# Patient Record
Sex: Female | Born: 1935 | Race: White | Hispanic: No | Marital: Married
Health system: Southern US, Community
[De-identification: ages and names within clinical notes are randomized; demographics above are authoritative.]

---

## 2005-01-30 ENCOUNTER — Encounter: Admission: RE | Admit: 2005-01-30 | Discharge: 2005-01-30 | Payer: Self-pay | Admitting: Neurosurgery

## 2005-02-26 ENCOUNTER — Encounter: Admission: RE | Admit: 2005-02-26 | Discharge: 2005-02-26 | Payer: Self-pay | Admitting: Neurosurgery

## 2005-03-21 ENCOUNTER — Encounter: Admission: RE | Admit: 2005-03-21 | Discharge: 2005-03-21 | Payer: Self-pay | Admitting: Neurosurgery

## 2005-04-03 ENCOUNTER — Encounter: Admission: RE | Admit: 2005-04-03 | Discharge: 2005-04-03 | Payer: Self-pay | Admitting: Neurosurgery

## 2008-07-13 ENCOUNTER — Encounter: Admission: RE | Admit: 2008-07-13 | Discharge: 2008-07-13 | Payer: Self-pay | Admitting: Cardiovascular Disease

## 2008-07-15 ENCOUNTER — Inpatient Hospital Stay (HOSPITAL_COMMUNITY): Admission: RE | Admit: 2008-07-15 | Discharge: 2008-07-16 | Payer: Self-pay | Admitting: Cardiovascular Disease

## 2009-06-14 ENCOUNTER — Inpatient Hospital Stay (HOSPITAL_COMMUNITY): Admission: RE | Admit: 2009-06-14 | Discharge: 2009-06-20 | Payer: Self-pay | Admitting: Orthopedic Surgery

## 2009-11-08 ENCOUNTER — Inpatient Hospital Stay (HOSPITAL_COMMUNITY): Admission: RE | Admit: 2009-11-08 | Discharge: 2009-11-12 | Payer: Self-pay | Admitting: Orthopedic Surgery

## 2010-06-09 LAB — PREPARE RBC (CROSSMATCH)

## 2010-06-09 LAB — URINALYSIS, ROUTINE W REFLEX MICROSCOPIC
Bilirubin Urine: NEGATIVE
Ketones, ur: NEGATIVE mg/dL
Nitrite: POSITIVE — AB
Protein, ur: NEGATIVE mg/dL
Specific Gravity, Urine: 1.022 (ref 1.005–1.030)
Urobilinogen, UA: 1 mg/dL (ref 0.0–1.0)

## 2010-06-09 LAB — TYPE AND SCREEN
ABO/RH(D): O POS
Antibody Screen: POSITIVE
DAT, IgG: POSITIVE
Donor AG Type: NEGATIVE
Donor AG Type: NEGATIVE
Donor AG Type: NEGATIVE
Donor AG Type: NEGATIVE
PT AG Type: NEGATIVE

## 2010-06-09 LAB — PROTIME-INR
INR: 1.1 (ref 0.00–1.49)
INR: 1.29 (ref 0.00–1.49)
INR: 1.37 (ref 0.00–1.49)
INR: 0.98 (ref 0.00–1.49)
Prothrombin Time: 14.4 seconds (ref 11.6–15.2)
Prothrombin Time: 16.3 seconds — ABNORMAL HIGH (ref 11.6–15.2)
Prothrombin Time: 17.1 seconds — ABNORMAL HIGH (ref 11.6–15.2)

## 2010-06-09 LAB — CBC
HCT: 17.1 % — ABNORMAL LOW (ref 36.0–46.0)
HCT: 30.8 % — ABNORMAL LOW (ref 36.0–46.0)
HCT: 31.8 % — ABNORMAL LOW (ref 36.0–46.0)
HCT: 38.5 % (ref 36.0–46.0)
Hemoglobin: 10.6 g/dL — ABNORMAL LOW (ref 12.0–15.0)
Hemoglobin: 11.3 g/dL — ABNORMAL LOW (ref 12.0–15.0)
Hemoglobin: 5.7 g/dL — CL (ref 12.0–15.0)
MCH: 31.1 pg (ref 26.0–34.0)
MCH: 31.8 pg (ref 26.0–34.0)
MCHC: 33.3 g/dL (ref 30.0–36.0)
MCHC: 35.5 g/dL (ref 30.0–36.0)
MCV: 87.6 fL (ref 78.0–100.0)
MCV: 89.3 fL (ref 78.0–100.0)
MCV: 95.5 fL (ref 78.0–100.0)
Platelets: 102 10*3/uL — ABNORMAL LOW (ref 150–400)
Platelets: 113 10*3/uL — ABNORMAL LOW (ref 150–400)
Platelets: 145 10*3/uL — ABNORMAL LOW (ref 150–400)
RBC: 1.79 MIL/uL — ABNORMAL LOW (ref 3.87–5.11)
RBC: 3.45 MIL/uL — ABNORMAL LOW (ref 3.87–5.11)
RBC: 3.63 MIL/uL — ABNORMAL LOW (ref 3.87–5.11)
RDW: 13.2 % (ref 11.5–15.5)
RDW: 14.7 % (ref 11.5–15.5)
RDW: 13.1 % (ref 11.5–15.5)
WBC: 6.5 10*3/uL (ref 4.0–10.5)
WBC: 7.3 10*3/uL (ref 4.0–10.5)
WBC: 7.9 10*3/uL (ref 4.0–10.5)
WBC: 4.5 10*3/uL (ref 4.0–10.5)

## 2010-06-09 LAB — BASIC METABOLIC PANEL
BUN: 21 mg/dL (ref 6–23)
BUN: 34 mg/dL — ABNORMAL HIGH (ref 6–23)
BUN: 35 mg/dL — ABNORMAL HIGH (ref 6–23)
BUN: 18 mg/dL (ref 6–23)
CO2: 19 mEq/L (ref 19–32)
CO2: 23 mEq/L (ref 19–32)
CO2: 24 mEq/L (ref 19–32)
Calcium: 7.6 mg/dL — ABNORMAL LOW (ref 8.4–10.5)
Calcium: 7.7 mg/dL — ABNORMAL LOW (ref 8.4–10.5)
Calcium: 7.9 mg/dL — ABNORMAL LOW (ref 8.4–10.5)
Chloride: 103 mEq/L (ref 96–112)
Chloride: 107 mEq/L (ref 96–112)
Chloride: 99 mEq/L (ref 96–112)
Creatinine, Ser: 1.02 mg/dL (ref 0.4–1.2)
Creatinine, Ser: 1.71 mg/dL — ABNORMAL HIGH (ref 0.4–1.2)
Creatinine, Ser: 2.52 mg/dL — ABNORMAL HIGH (ref 0.4–1.2)
GFR calc Af Amer: 23 mL/min — ABNORMAL LOW (ref >60)
GFR calc Af Amer: 36 mL/min — ABNORMAL LOW (ref >60)
GFR calc Af Amer: >60 mL/min (ref >60)
GFR calc non Af Amer: 19 mL/min — ABNORMAL LOW (ref >60)
GFR calc non Af Amer: 29 mL/min — ABNORMAL LOW (ref >60)
GFR calc non Af Amer: 53 mL/min — ABNORMAL LOW (ref >60)
GFR calc non Af Amer: 41 mL/min — ABNORMAL LOW (ref >60)
Glucose, Bld: 107 mg/dL — ABNORMAL HIGH (ref 70–99)
Glucose, Bld: 111 mg/dL — ABNORMAL HIGH (ref 70–99)
Glucose, Bld: 133 mg/dL — ABNORMAL HIGH (ref 70–99)
Potassium: 3.8 mEq/L (ref 3.5–5.1)
Potassium: 4.2 mEq/L (ref 3.5–5.1)
Potassium: 5.4 mEq/L — ABNORMAL HIGH (ref 3.5–5.1)
Potassium: 3.8 mEq/L (ref 3.5–5.1)
Sodium: 128 mEq/L — ABNORMAL LOW (ref 135–145)
Sodium: 134 mEq/L — ABNORMAL LOW (ref 135–145)
Sodium: 135 mEq/L (ref 135–145)
Sodium: 136 mEq/L (ref 135–145)

## 2010-06-09 LAB — APTT: aPTT: 31 seconds (ref 24–37)

## 2010-06-09 LAB — SURGICAL PCR SCREEN: MRSA, PCR: NEGATIVE

## 2010-06-09 LAB — URINE MICROSCOPIC-ADD ON

## 2010-06-19 LAB — BASIC METABOLIC PANEL
BUN: 24 mg/dL — ABNORMAL HIGH (ref 6–23)
CO2: 25 mEq/L (ref 19–32)
CO2: 26 mEq/L (ref 19–32)
Calcium: 7.6 mg/dL — ABNORMAL LOW (ref 8.4–10.5)
Chloride: 102 mEq/L (ref 96–112)
Chloride: 102 mEq/L (ref 96–112)
Chloride: 99 mEq/L (ref 96–112)
Creatinine, Ser: 1.14 mg/dL (ref 0.4–1.2)
Creatinine, Ser: 1.19 mg/dL (ref 0.4–1.2)
Creatinine, Ser: 1.38 mg/dL — ABNORMAL HIGH (ref 0.4–1.2)
GFR calc Af Amer: 43 mL/min — ABNORMAL LOW (ref >60)
GFR calc Af Amer: 45 mL/min — ABNORMAL LOW (ref >60)
GFR calc Af Amer: 54 mL/min — ABNORMAL LOW (ref >60)
GFR calc Af Amer: 57 mL/min — ABNORMAL LOW (ref >60)
GFR calc non Af Amer: 45 mL/min — ABNORMAL LOW (ref >60)
Potassium: 3.5 mEq/L (ref 3.5–5.1)
Potassium: 3.9 mEq/L (ref 3.5–5.1)
Potassium: 4.2 mEq/L (ref 3.5–5.1)
Sodium: 133 mEq/L — ABNORMAL LOW (ref 135–145)
Sodium: 134 mEq/L — ABNORMAL LOW (ref 135–145)

## 2010-06-19 LAB — CBC
HCT: 21 % — ABNORMAL LOW (ref 36.0–46.0)
HCT: 27 % — ABNORMAL LOW (ref 36.0–46.0)
HCT: 39.2 % (ref 36.0–46.0)
Hemoglobin: 13.3 g/dL (ref 12.0–15.0)
Hemoglobin: 7.2 g/dL — ABNORMAL LOW (ref 12.0–15.0)
Hemoglobin: 9.3 g/dL — ABNORMAL LOW (ref 12.0–15.0)
MCHC: 34.4 g/dL (ref 30.0–36.0)
MCV: 90.6 fL (ref 78.0–100.0)
MCV: 92.2 fL (ref 78.0–100.0)
MCV: 92.5 fL (ref 78.0–100.0)
Platelets: 115 10*3/uL — ABNORMAL LOW (ref 150–400)
RBC: 2.27 MIL/uL — ABNORMAL LOW (ref 3.87–5.11)
RBC: 2.85 MIL/uL — ABNORMAL LOW (ref 3.87–5.11)
RBC: 2.97 MIL/uL — ABNORMAL LOW (ref 3.87–5.11)
RBC: 4.25 MIL/uL (ref 3.87–5.11)
WBC: 4.4 10*3/uL (ref 4.0–10.5)
WBC: 4.8 10*3/uL (ref 4.0–10.5)
WBC: 7 10*3/uL (ref 4.0–10.5)
WBC: 7.8 10*3/uL (ref 4.0–10.5)

## 2010-06-19 LAB — URINE MICROSCOPIC-ADD ON

## 2010-06-19 LAB — PROTIME-INR
INR: 1.22 (ref 0.00–1.49)
INR: 1.62 — ABNORMAL HIGH (ref 0.00–1.49)
INR: 1.73 — ABNORMAL HIGH (ref 0.00–1.49)
INR: 1.9 — ABNORMAL HIGH (ref 0.00–1.49)
Prothrombin Time: 15.3 seconds — ABNORMAL HIGH (ref 11.6–15.2)
Prothrombin Time: 15.8 seconds — ABNORMAL HIGH (ref 11.6–15.2)
Prothrombin Time: 19.5 seconds — ABNORMAL HIGH (ref 11.6–15.2)
Prothrombin Time: 20.1 seconds — ABNORMAL HIGH (ref 11.6–15.2)

## 2010-06-19 LAB — TYPE AND SCREEN

## 2010-06-19 LAB — URINALYSIS, ROUTINE W REFLEX MICROSCOPIC
Glucose, UA: NEGATIVE mg/dL
Nitrite: POSITIVE — AB
pH: 5.5 (ref 5.0–8.0)

## 2010-06-19 LAB — HEMOGLOBIN AND HEMATOCRIT, BLOOD
HCT: 25.6 % — ABNORMAL LOW (ref 36.0–46.0)
HCT: 27.9 % — ABNORMAL LOW (ref 36.0–46.0)
Hemoglobin: 11.9 g/dL — ABNORMAL LOW (ref 12.0–15.0)
Hemoglobin: 9.7 g/dL — ABNORMAL LOW (ref 12.0–15.0)

## 2010-06-19 LAB — CROSSMATCH

## 2010-06-19 LAB — APTT: aPTT: 34 seconds (ref 24–37)

## 2010-07-05 LAB — CBC
Hemoglobin: 10.3 g/dL — ABNORMAL LOW (ref 12.0–15.0)
MCHC: 34.4 g/dL (ref 30.0–36.0)
MCV: 93.1 fL (ref 78.0–100.0)
Platelets: 147 10*3/uL — ABNORMAL LOW (ref 150–400)
RBC: 3.21 MIL/uL — ABNORMAL LOW (ref 3.87–5.11)
WBC: 3.4 10*3/uL — ABNORMAL LOW (ref 4.0–10.5)
WBC: 5.6 10*3/uL (ref 4.0–10.5)

## 2010-07-05 LAB — BASIC METABOLIC PANEL
Calcium: 8.1 mg/dL — ABNORMAL LOW (ref 8.4–10.5)
Creatinine, Ser: 1.37 mg/dL — ABNORMAL HIGH (ref 0.4–1.2)
GFR calc Af Amer: 46 mL/min — ABNORMAL LOW (ref >60)
GFR calc non Af Amer: 38 mL/min — ABNORMAL LOW (ref >60)
Sodium: 132 mEq/L — ABNORMAL LOW (ref 135–145)

## 2010-08-08 NOTE — Cardiovascular Report (Signed)
NAMEMarland Oliver  AUDELIA, KNAPE NO.:  [id number]   MEDICAL RECORD NO.:  [id number]          PATIENT TYPE:  INP   LOCATION:  2504                         FACILITY:  [facility]   PHYSICIAN:  Nicki Guadalajara, M.D.     DATE OF BIRTH:  [birth year]   DATE OF PROCEDURE:  07/15/2008  DATE OF DISCHARGE:                            CARDIAC CATHETERIZATION   PERCUTANEOUS CORONARY INTERVENTION NOTE   PROCEDURE:  Percutaneous transluminal coronary angioplasty/stenting of  the left anterior descending artery.   INDICATIONS:  Ms. [name] is a 76 year old patient of Dr. Algie Coffer,  who has a history of hypertension and recently has experienced  increasing episodes of chest pain and exertional dyspnea.  Diagnostic  cardiac catheterization was performed earlier this morning by Dr. Orpah Cobb.  Please refer to his diagnostic cardiac catheterization report.  The patient was found to have high-grade 90-95% eccentric stenosis in a  small LAD system after the diagonal vessel in addition to mild proximal  LAD disease.  I was contacted to perform percutaneous coronary  intervention on the high-grade LAD lesion.   PROCEDURE:  The patient was still on the cardiac catheterization table  from her diagnostic procedure done by Dr. Algie Coffer.  She received an  additional 3 mg of intravenous Valium for additional sedation.  The 5-  French sheath which already was in place was upgraded to a 6-French  arterial sheath.  Double bolus Integrilin and 600 mg of oral Plavix were  administered.  A 6-French XB LAD 3.5 guide was used for the procedure.  The patient received 3200 units of intravenous heparin per protocol.  ACT was documented to be therapeutic.  A Prowater wire was able to be  passed into the LAD and was able to cross the high-grade eccentric  stenosis in this small vessel.  Initially, a 2.0 Sprinter Legend balloon  was used for dilatation.  Several dilatations were made with this  balloon.  The patient  also was started on intravenous nitroglycerin and  received several additional doses of intracoronary nitroglycerin.  It  was felt that the high-grade 95% lesion was followed by __________ Edwina Barth drug-eluting stent was then used to cover both sites with careful  attention to place the stent immediately beyond the diagonal takeoff so  as not to jail this diagonal vessel.  Two inflations were made up to 12  atmospheres.  A 2.5 x 12 mm noncompliant Voyager balloon was then used  for poststent dilatation up to approximately 2.32 mm size.  Scout  angiography confirmed an excellent angiographic result.  There was brisk  TIMI 3 flow.  There was no evidence for dissection.   HEMODYNAMIC DATA:  Central aortic pressure was 131/82.   ANGIOGRAPHIC FINDINGS:  Please refer to Dr. Roseanne Kaufman diagnostic  catheterization report.  At the start of the intervention, the LAD had  proximal irregularity of 20-30% ostially.  After giving rise to a  diagonal vessel, the LAD was 95% eccentrically stenosed, followed by  another area of 60-70% narrowing.  Following PTCA and ultimate stenting  with a 2.25 x 16 mm TAXUS  Adam DES stent with poststent dilatation up to  2.32 mm, the 95 and 70% stenoses were reduced to 0%.  There was brisk  TIMI 3 flow.  There was no evidence for dissection.   IMPRESSION:  Successful percutaneous coronary intervention of the left  anterior descending with ultimate insertion of a 2.25 x 16 mm TAXUS Adam  drug-eluting stent postdilated to 2.32 mm with the 95 and 70% stenoses  being reduced to 0%, done with double bolus Integrilin/weight-adjusted  heparin/oral clopidogrel/intracoronary and intravenous nitroglycerin.           ______________________________  Nicki Guadalajara, M.D.     TK/MEDQ  D:  07/15/2008  T:  07/15/2008  Job:  [id number]

## 2010-08-08 NOTE — Discharge Summary (Signed)
[name], [name]                ACCOUNT NO.:  [id number]   MEDICAL RECORD NO.:  [id number]          PATIENT TYPE:  INP   LOCATION:  2504                         FACILITY:  [facility]   PHYSICIAN:  [name], M.D.  DATE OF BIRTH:  1935/01/03   DATE OF ADMISSION:  07/15/2008  DATE OF DISCHARGE:  07/16/2008                               DISCHARGE SUMMARY   FINAL DIAGNOSES:  1. One-vessel coronary artery disease.  2. Chest pain.  3. Hypertension.  4. Anxiety and depression.   DISCHARGE MEDICATIONS:  1. Enteric-coated aspirin 325 mg 1 daily.  2. Plavix 75 mg 1 daily.  3. Potassium 20 mEq 2 daily.  4. Citalopram 20 mg daily.  5. Lisinopril/HCTZ 10/12.5 mg daily.  6. Tramadol 50 mg daily.  7. Ibuprofen 200 mg twice daily as needed.  8. Prilosec 20 mg 1 daily.   DISCHARGE DIET:  Low-sodium, low-fat, and heart-healthy diet.   WOUND CARE INSTRUCTION:  The patient to notify right groin pain,  swelling, or discharge.   ACTIVITY:  The patient to increase activity slowly.   SPECIAL INSTRUCTION:  The patient to stop any activity that causes chest  pain, shortness of breath, dizziness, sweating, or excessive weakness,  and the patient to get blood work done in 5-6 days and drink extra  fluids for 1 week.   FOLLOWUP:  By Dr. Orpah Cobb in 1 week.  The patient to call [phone]  for appointment.   HISTORY:  This 76 year old white female presented with exertional  dyspnea and a pressure-type chest pain along with some nausea and  sweating spell.   PHYSICAL EXAMINATION:  VITAL SIGNS:  Pulse 90, respirations 14, blood  pressure 110/60, height 4 feet 11 inches, weight 135 pounds, and BMI 25.  HEENT:  The patient is normocephalic and atraumatic with brown eyes.  Conjunctivae pink.  Sclerae white.  The patient wears glasses and  dentures.  NECK:  No JVD.  No carotid bruit.  LUNGS:  Clear.  HEART:  Normal S1 and S2.  ABDOMEN:  Soft and nontender.  EXTREMITIES:  No edema, cyanosis,  or clubbing.  NEUROLOGIC:  Cranial nerves grossly intact.  The patient is right-  handed.  SKIN:  Warm and dry.   LABORATORY DATA:  Normal hemoglobin/hematocrit, WBC count, and platelet  count; WBC count slightly borderline at 3.3000.  normal electrolytes,  BUN, and creatinine.  Subsequent potassium was down to 3.2.   EKG was normal sinus rhythm with possible anteroseptal wall MI.  Cholesterol was borderline at 206.   Cardiac catheterization showed 80-90% eccentric proximal-to-mid junction  LAD lesion, with mild hypokinesia of anterior wall on left  ventriculogram.   HOSPITAL COURSE:  The patient was admitted to 2500 units after cardiac  catheterization showing eccentric proximal to mid junction left anterior  descending coronary artery 90-95% lesion.  Dr. Tresa Endo was called to put  stent, which reduced the stenosis to 0% with good flow.  The patient was  then admitted to hospital for overnight stay.  Her condition remained  stable, except for low potassium of 3.2, for which she got  supplemental  potassium.  Her activity was unremarkable and she was discharged home in  satisfactory condition with follow-up by me in 1 week.  She was advised  to drink extra fluids and get blood work done in 1 week.   She will also be started on a low dose of Crestor for her known coronary  artery disease.      [name], M.D.  Electronically Signed     ASK/MEDQ  D:  07/16/2008  T:  07/17/2008  Job:  [id number]

## 2010-09-05 ENCOUNTER — Inpatient Hospital Stay (HOSPITAL_COMMUNITY)
Admission: AD | Admit: 2010-09-05 | Discharge: 2010-09-10 | DRG: 287 | Disposition: A | Discharge status: Home / Self Care | Payer: Medicare Other | Source: Ambulatory Visit | Attending: Cardiovascular Disease | Admitting: Cardiovascular Disease

## 2010-09-05 DIAGNOSIS — Y9210 Unspecified residential institution as the place of occurrence of the external cause: Secondary | ICD-10-CM | POA: Diagnosis not present

## 2010-09-05 DIAGNOSIS — I10 Essential (primary) hypertension: Secondary | ICD-10-CM | POA: Diagnosis present

## 2010-09-05 DIAGNOSIS — Z9861 Coronary angioplasty status: Secondary | ICD-10-CM

## 2010-09-05 DIAGNOSIS — Z7902 Long term (current) use of antithrombotics/antiplatelets: Secondary | ICD-10-CM

## 2010-09-05 DIAGNOSIS — Z8249 Family history of ischemic heart disease and other diseases of the circulatory system: Secondary | ICD-10-CM

## 2010-09-05 DIAGNOSIS — R5383 Other fatigue: Secondary | ICD-10-CM | POA: Diagnosis present

## 2010-09-05 DIAGNOSIS — I498 Other specified cardiac arrhythmias: Secondary | ICD-10-CM | POA: Diagnosis not present

## 2010-09-05 DIAGNOSIS — Y840 Cardiac catheterization as the cause of abnormal reaction of the patient, or of later complication, without mention of misadventure at the time of the procedure: Secondary | ICD-10-CM | POA: Diagnosis not present

## 2010-09-05 DIAGNOSIS — E785 Hyperlipidemia, unspecified: Secondary | ICD-10-CM | POA: Diagnosis present

## 2010-09-05 DIAGNOSIS — Z7982 Long term (current) use of aspirin: Secondary | ICD-10-CM

## 2010-09-05 DIAGNOSIS — IMO0002 Reserved for concepts with insufficient information to code with codable children: Secondary | ICD-10-CM | POA: Diagnosis not present

## 2010-09-05 DIAGNOSIS — D500 Iron deficiency anemia secondary to blood loss (chronic): Secondary | ICD-10-CM | POA: Diagnosis not present

## 2010-09-05 DIAGNOSIS — I2510 Atherosclerotic heart disease of native coronary artery without angina pectoris: Secondary | ICD-10-CM | POA: Diagnosis present

## 2010-09-05 DIAGNOSIS — F411 Generalized anxiety disorder: Secondary | ICD-10-CM | POA: Diagnosis present

## 2010-09-05 DIAGNOSIS — R42 Dizziness and giddiness: Secondary | ICD-10-CM | POA: Diagnosis present

## 2010-09-05 DIAGNOSIS — R5381 Other malaise: Secondary | ICD-10-CM | POA: Diagnosis present

## 2010-09-05 DIAGNOSIS — R0789 Other chest pain: Principal | ICD-10-CM | POA: Diagnosis present

## 2010-09-05 DIAGNOSIS — Z79899 Other long term (current) drug therapy: Secondary | ICD-10-CM

## 2010-09-05 DIAGNOSIS — E039 Hypothyroidism, unspecified: Secondary | ICD-10-CM | POA: Diagnosis present

## 2010-09-05 LAB — DIFFERENTIAL
Basophils Relative: 0 % (ref 0–1)
Lymphocytes Relative: 28 % (ref 12–46)
Lymphs Abs: 0.9 10*3/uL (ref 0.7–4.0)
Monocytes Relative: 16 % — ABNORMAL HIGH (ref 3–12)
Neutro Abs: 1.8 10*3/uL (ref 1.7–7.7)
Neutrophils Relative %: 54 % (ref 43–77)

## 2010-09-05 LAB — COMPREHENSIVE METABOLIC PANEL
Albumin: 3.7 g/dL (ref 3.5–5.2)
Alkaline Phosphatase: 71 U/L (ref 39–117)
BUN: 29 mg/dL — ABNORMAL HIGH (ref 6–23)
CO2: 24 mEq/L (ref 19–32)
Chloride: 99 mEq/L (ref 96–112)
Potassium: 3.5 mEq/L (ref 3.5–5.1)
Total Bilirubin: 0.2 mg/dL — ABNORMAL LOW (ref 0.3–1.2)

## 2010-09-05 LAB — CBC
HCT: 35.9 % — ABNORMAL LOW (ref 36.0–46.0)
Hemoglobin: 12.5 g/dL (ref 12.0–15.0)
MCH: 32.3 pg (ref 26.0–34.0)
MCV: 92.8 fL (ref 78.0–100.0)
RBC: 3.87 MIL/uL (ref 3.87–5.11)

## 2010-09-05 LAB — CARDIAC PANEL(CRET KIN+CKTOT+MB+TROPI): Total CK: 43 U/L (ref 7–177)

## 2010-09-05 LAB — TSH: TSH: 4.168 u[IU]/mL (ref 0.350–4.500)

## 2010-09-05 LAB — PROTIME-INR: Prothrombin Time: 12.5 seconds (ref 11.6–15.2)

## 2010-09-06 LAB — CARDIAC PANEL(CRET KIN+CKTOT+MB+TROPI)
CK, MB: 1.9 ng/mL (ref 0.3–4.0)
CK, MB: 2.2 ng/mL (ref 0.3–4.0)
Relative Index: INVALID (ref 0.0–2.5)
Relative Index: INVALID (ref 0.0–2.5)
Total CK: 37 U/L (ref 7–177)
Total CK: 44 U/L (ref 7–177)

## 2010-09-06 LAB — CBC
HCT: 38.2 % (ref 36.0–46.0)
Hemoglobin: 13.1 g/dL (ref 12.0–15.0)
MCHC: 34.3 g/dL (ref 30.0–36.0)
WBC: 2.9 10*3/uL — ABNORMAL LOW (ref 4.0–10.5)

## 2010-09-06 LAB — LIPID PANEL: Cholesterol: 164 mg/dL (ref 0–200)

## 2010-09-07 ENCOUNTER — Inpatient Hospital Stay (HOSPITAL_COMMUNITY): Payer: Medicare Other

## 2010-09-07 LAB — CBC
Platelets: 149 10*3/uL — ABNORMAL LOW (ref 150–400)
RBC: 4.13 MIL/uL (ref 3.87–5.11)
WBC: 5.7 10*3/uL (ref 4.0–10.5)

## 2010-09-07 LAB — HEPARIN LEVEL (UNFRACTIONATED): Heparin Unfractionated: 0.61 IU/mL (ref 0.30–0.70)

## 2010-09-07 LAB — POCT ACTIVATED CLOTTING TIME: Activated Clotting Time: 111 seconds

## 2010-09-07 LAB — HEMOGLOBIN AND HEMATOCRIT, BLOOD: HCT: 33.5 % — ABNORMAL LOW (ref 36.0–46.0)

## 2010-09-08 LAB — CBC
Hemoglobin: 9.8 g/dL — ABNORMAL LOW (ref 12.0–15.0)
MCH: 31.7 pg (ref 26.0–34.0)
MCHC: 33.7 g/dL (ref 30.0–36.0)
Platelets: 119 10*3/uL — ABNORMAL LOW (ref 150–400)

## 2010-09-09 LAB — CBC
MCHC: 34.3 g/dL (ref 30.0–36.0)
MCV: 94.6 fL (ref 78.0–100.0)
Platelets: 118 10*3/uL — ABNORMAL LOW (ref 150–400)
RDW: 13 % (ref 11.5–15.5)
WBC: 7.1 10*3/uL (ref 4.0–10.5)

## 2010-09-09 LAB — BASIC METABOLIC PANEL
Chloride: 100 mEq/L (ref 96–112)
Creatinine, Ser: 1.05 mg/dL (ref 0.50–1.10)
GFR calc Af Amer: >60 mL/min (ref >60)
GFR calc non Af Amer: 51 mL/min — ABNORMAL LOW (ref >60)

## 2010-09-10 LAB — CBC
HCT: 28.9 % — ABNORMAL LOW (ref 36.0–46.0)
Platelets: 122 10*3/uL — ABNORMAL LOW (ref 150–400)
RDW: 13 % (ref 11.5–15.5)
WBC: 4.5 10*3/uL (ref 4.0–10.5)

## 2010-09-14 NOTE — Discharge Summary (Signed)
[name], [name]                ACCOUNT NO.:  [id number]  MEDICAL RECORD NO.:  [id number]  LOCATION:  2028                         FACILITY:  [facility]  PHYSICIAN:  Ricki Rodriguez, M.D.  DATE OF BIRTH:  [birth year]  DATE OF ADMISSION:  09/05/2010 DATE OF DISCHARGE:  09/10/2010                              DISCHARGE SUMMARY   FINAL DIAGNOSES: 1. Chest pain. 2. Coronary artery disease. 3. Weakness. 4. Dizziness. 5. Moderate pelvic hematoma. 6. Hypothyroidism. 7. Hypertension. 8. Anxiety.  DISCHARGE MEDICATIONS: 1. Xanax 0.25 mg one daily. 2. Amlodipine 5 mg daily. 3. Nitroglycerin 0.4 mg tablet one under the tongue every 5 minutes x3     as needed for chest pain. 4. Lisinopril/hydrochlorothiazide 40/25 mg half a tablet in the     morning. 5. Aspirin 81 mg daily. 6. Citalopram 20 mg daily. 7. Plavix 75 mg daily. 8. Crestor 5 mg on Monday, Wednesday, Friday. 9. K-Dur 20 mEq one twice daily. 10.Levothyroxine 25 mcg one in the morning. 11.Metoprolol tartrate 25 mg one twice daily. 12.Tylenol 325 mg two tablets every 6 hours as needed for pain. 13.The patient is to discontinue Prilosec tablet.  DISCHARGE DIET:  Low-sodium heart-healthy diet.  DISCHARGE ACTIVITY:  The patient is to increase activity gradually as tolerated and avoid any strenuous activity for 1 week, use cane or walker as needed.  Followup by Dr. Orpah Cobb in 1 week.  The patient is to call [phone] for appointment.  CONDITION ON DISCHARGE:  Improved.  HISTORY:  This 76 year old white female presented with weakness, dizziness, and chest pain of 1 day duration.  The chest pain was retrosternal and described as heaviness without radiation.  Her past medical history is positive for hypertension for 10+ years, and she had a stent placement in LAD in April 2010.  PHYSICAL EXAMINATION:  VITAL SIGNS:  Pulse 48, respirations 16, blood pressure 140/76, height 4 feet 11 inches, weight 132 pounds, body  mass index 27, temperature 98. GENERAL:  The patient is short and well-nourished female, in no acute distress. HEENT:  The patient is normocephalic, atraumatic with brown eyes. Conjunctivae pink.  Sclerae white.  Throat showed midline pink tongue. The patient wears glasses and dentures. NECK:  No JVD, no bruit.  Full range of motion. LUNGS:  Clear bilaterally. HEART:  Normal S1 and S2. ABDOMEN:  Soft and nontender. EXTREMITIES:  No edema, cyanosis, or clubbing. CENTRAL NERVOUS SYSTEM:  Cranial nerves grossly intact.  Bilaterally equal grips.  The patient is right handed and moves all four extremities. SKIN:  Warm and dry.  LABORATORY DATA:  Normal hemoglobin/hematocrit.  WBC count low at 3.4 and platelet count 141,000.  INR was 0.91.  CK-MB, troponin I negative x3.  Thyroid stimulating hormone borderline at 4.168.  Lipid profile essentially unremarkable with HDL cholesterol of 44 and LDL cholesterol of 95, triglyceride of 127.  MRSA screening negative.  Hemoglobin on September 08, 2010, was 9.8 and hemoglobin on the day of discharge was also9.8.  CT of the abdomen and pelvis showed moderate hematoma in pelvic area with no intraperitoneal hemorrhage or hematoma, small hiatal hernia, scarring involving the left kidney, sigmoid colon and cecal diverticulosis without evidence  of acute diverticulitis. Cardiac catheterization showed mild multivessel native vessel coronary artery disease with a patent LAD stent and normal LV systolic function.  HOSPITAL COURSE:  The patient was admitted to the telemetry unit. Myocardial infarction was ruled out.  She was given option of nuclear stress test versus cardiac catheterization.  The patient requested cardiac catheterization.  This failed to show critical coronary artery disease.  It also showed a patent left anterior descending coronary artery stent.  The patient had multiple attempts to access the femoral artery.  Postprocedure, she had localized  bruising and localized hematoma requiring extended bedrest and watch and admission to step-down unit.  Her hemoglobin dropped approximately 1-2 units.  Her condition stabilized in 24 hours, and she was discharged home in satisfactory condition after adjustment of her blood pressure medications, and she will be followed by me in 1 week.     Ricki Rodriguez, M.D.     ASK/MEDQ  D:  09/10/2010  T:  09/10/2010  Job:  [id number]  Electronically Signed by Orpah Cobb M.D. on 09/14/2010 09:06:28 AM

## 2010-09-14 NOTE — H&P (Signed)
[name], [name]                ACCOUNT NO.:  [id number]  MEDICAL RECORD NO.:  [id number]  LOCATION:  3701                         FACILITY:  [facility]  PHYSICIAN:  Ricki Rodriguez, M.D.  DATE OF BIRTH:  [birth year]  DATE OF ADMISSION:  09/05/2010 DATE OF DISCHARGE:                             HISTORY & PHYSICAL   The patient was admitted by Dr. Orpah Cobb.  CHIEF COMPLAINT:  Weakness, dizziness and chest pain.  HISTORY OF PRESENT ILLNESS:  This 76 year old white female presented with weakness, dizziness and chest pain of 1-day duration, the chest pain is retrosternal and described as heaviness.  PAST MEDICAL HISTORY:  Negative for diabetes.  Positive for hypertension for 10+ years.  No history of smoking, alcohol intake or drug intake.  PAST SURGICAL HISTORY:  Includes a stent placement in LAD in April of 2010.  PERSONAL:  The patient is married for 53+ years.  Husband is 72 year old.  She has 4 daughters and she is a retired Chemical engineer.  FAMILY HISTORY:  Mom died of cerebrovascular accident at age 1.  Dad died of myocardial infarction at age 14.  The patient has 3 brothers, one living, 2 died of myocardial infarction at age 16 and 62, and she has 6 sisters and one of the sister died of myocardial infarction.  MEDICATIONS: 1. Lisinopril HCT 10/12.5 mg half daily. 2. Potassium chloride 10 mEq two daily. 3. Metoprolol 25 mg one twice daily. 4. Aspirin 81 mg daily. 5. Plavix 75 mg daily. 6. Prilosec 20 mg daily. 7. Citalopram 20 mg daily. 8. Tramadol 50 mg 1 three times daily. 9. Xanax 0.25 mg one daily. 10.Crestor 5 mg every other day. 11.Co-Q10 one daily. 12.Prednisone 5 mg daily.  ALLERGIES:  NONE.  REVIEW OF SYSTEMS:  No recent weight gain, weight loss.  Positive vision change with patient wearing glasses.  Positive for dentures.  No history of cough, headache, hemoptysis.  Positive history of chest pain. Positive history of dyspnea.  No history  of asthma.  No history of GI bleed.  No history of hepatitis, blood transfusion or kidney stone.  No history of stroke, seizures or psychiatric admissions.  Positive history of arthritis.  PHYSICAL EXAMINATION:  VITAL SIGNS:  Pulse 48, respirations 16, blood pressure 140/76, height 4 feet 11 inches, weight 132 pounds, body mass index of 27, temperature of 98. GENERAL:  The patient is short and well nourished female in no acute distress. HEENT:  The patient is normocephalic, atraumatic with brown eyes. Conjunctivae pink.  Sclerae are white.  Throat showed midline pink tongue.  The patient wears glasses and dentures. NECK:  No JVD, no bruit, full range of motion. LUNGS:  Clear bilaterally. HEART:  Normal S1-S2. ABDOMEN:  Soft and nontender. EXTREMITIES:  No edema, cyanosis, clubbing. CNS: Cranial nerves grossly intact.  Bilateral equal grips.  The patient is right-handed and moves all 4 extremities. SKIN:  Warm and dry.  LABORATORY DATA:  Pending.  EKG, sinus bradycardia.  IMPRESSION: 1. Dizziness. 2. Cardiac dysrhythmia. 3. Coronary artery disease. 4. Hypertension.  PLAN:  Plan is to place the patient on monitor to rule out for myocardial infarction.  Check thyroid-stimulating  hormone.  Give oxygen and home medications except beta-blocker.     Ricki Rodriguez, M.D.     ASK/MEDQ  D:  09/05/2010  T:  09/06/2010  Job:  [id number]  Electronically Signed by Orpah Cobb M.D. on 09/14/2010 09:05:36 AM

## 2011-04-21 IMAGING — CR DG CHEST 2V
2 series · 2 of 2 positions shown · non-contrast
Comparison: 07/13/2008.

CLINICAL DATA: Preop respiratory exam.  DJD.

CHEST - 2 VIEW

[view not recorded (1 of 2)]
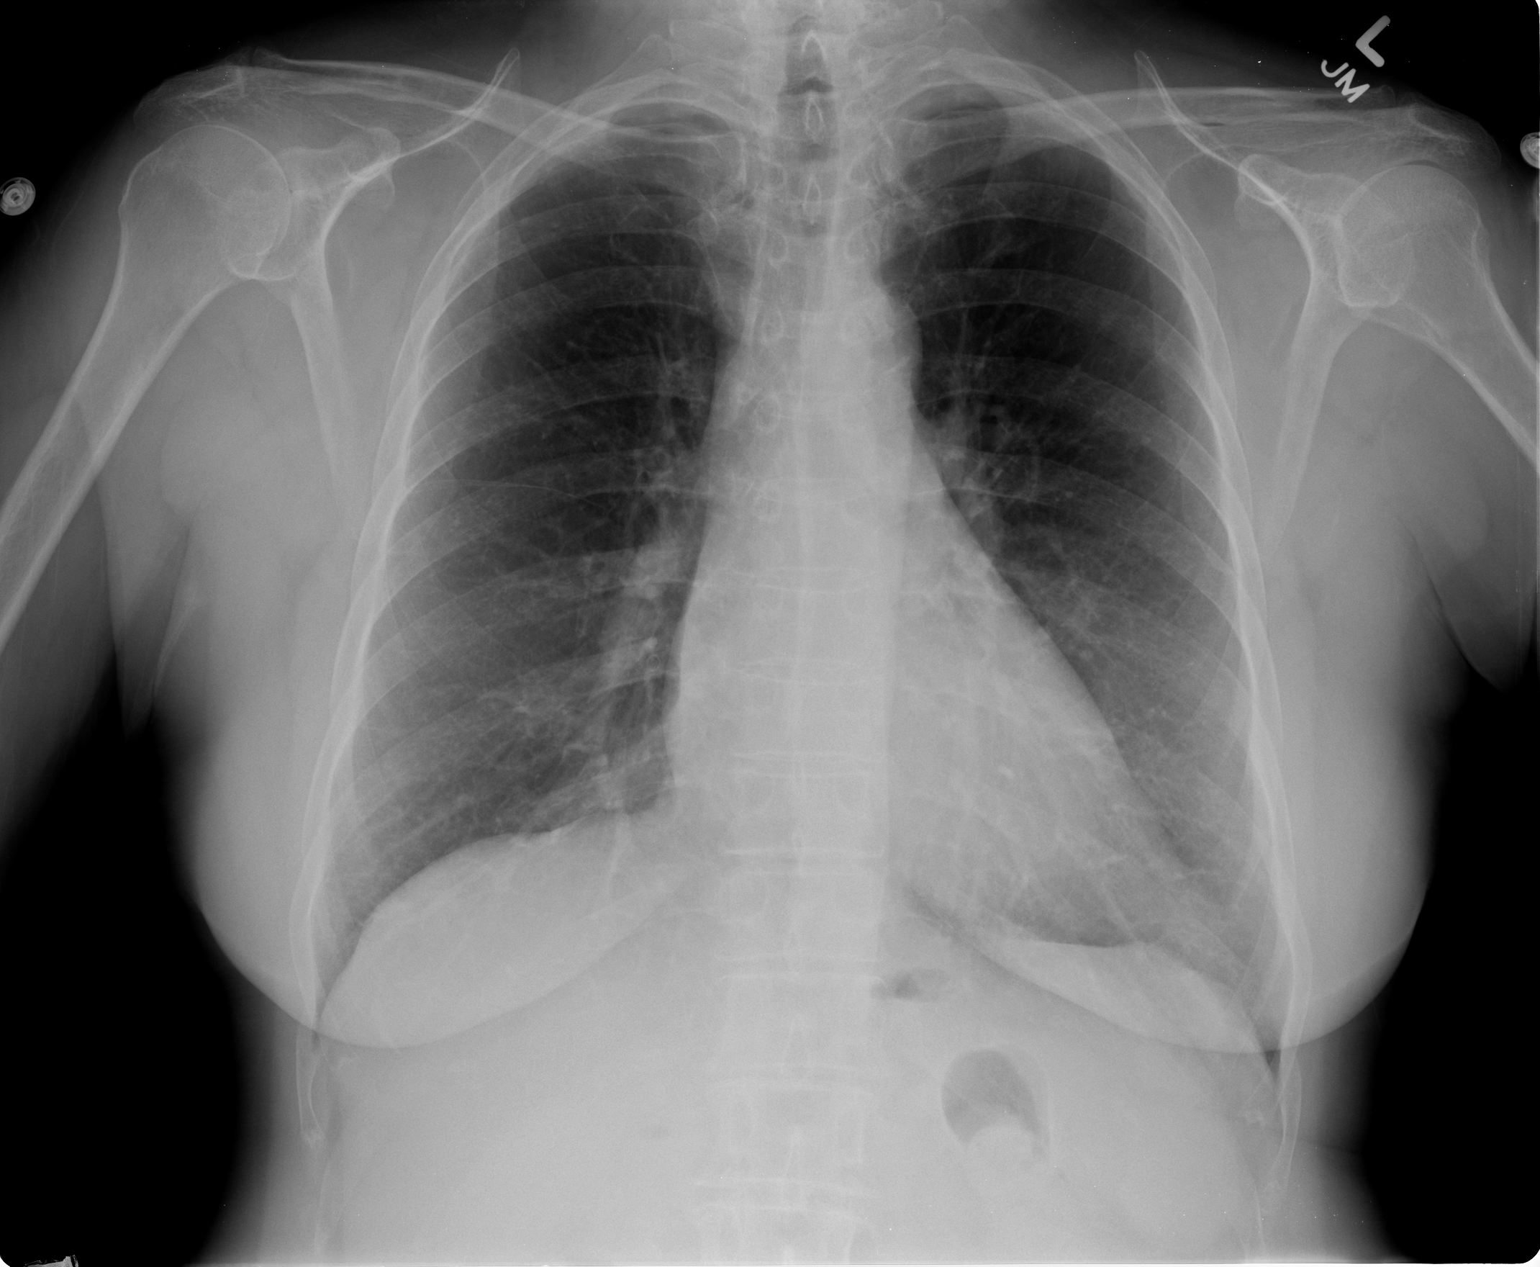

[view not recorded (2 of 2)]
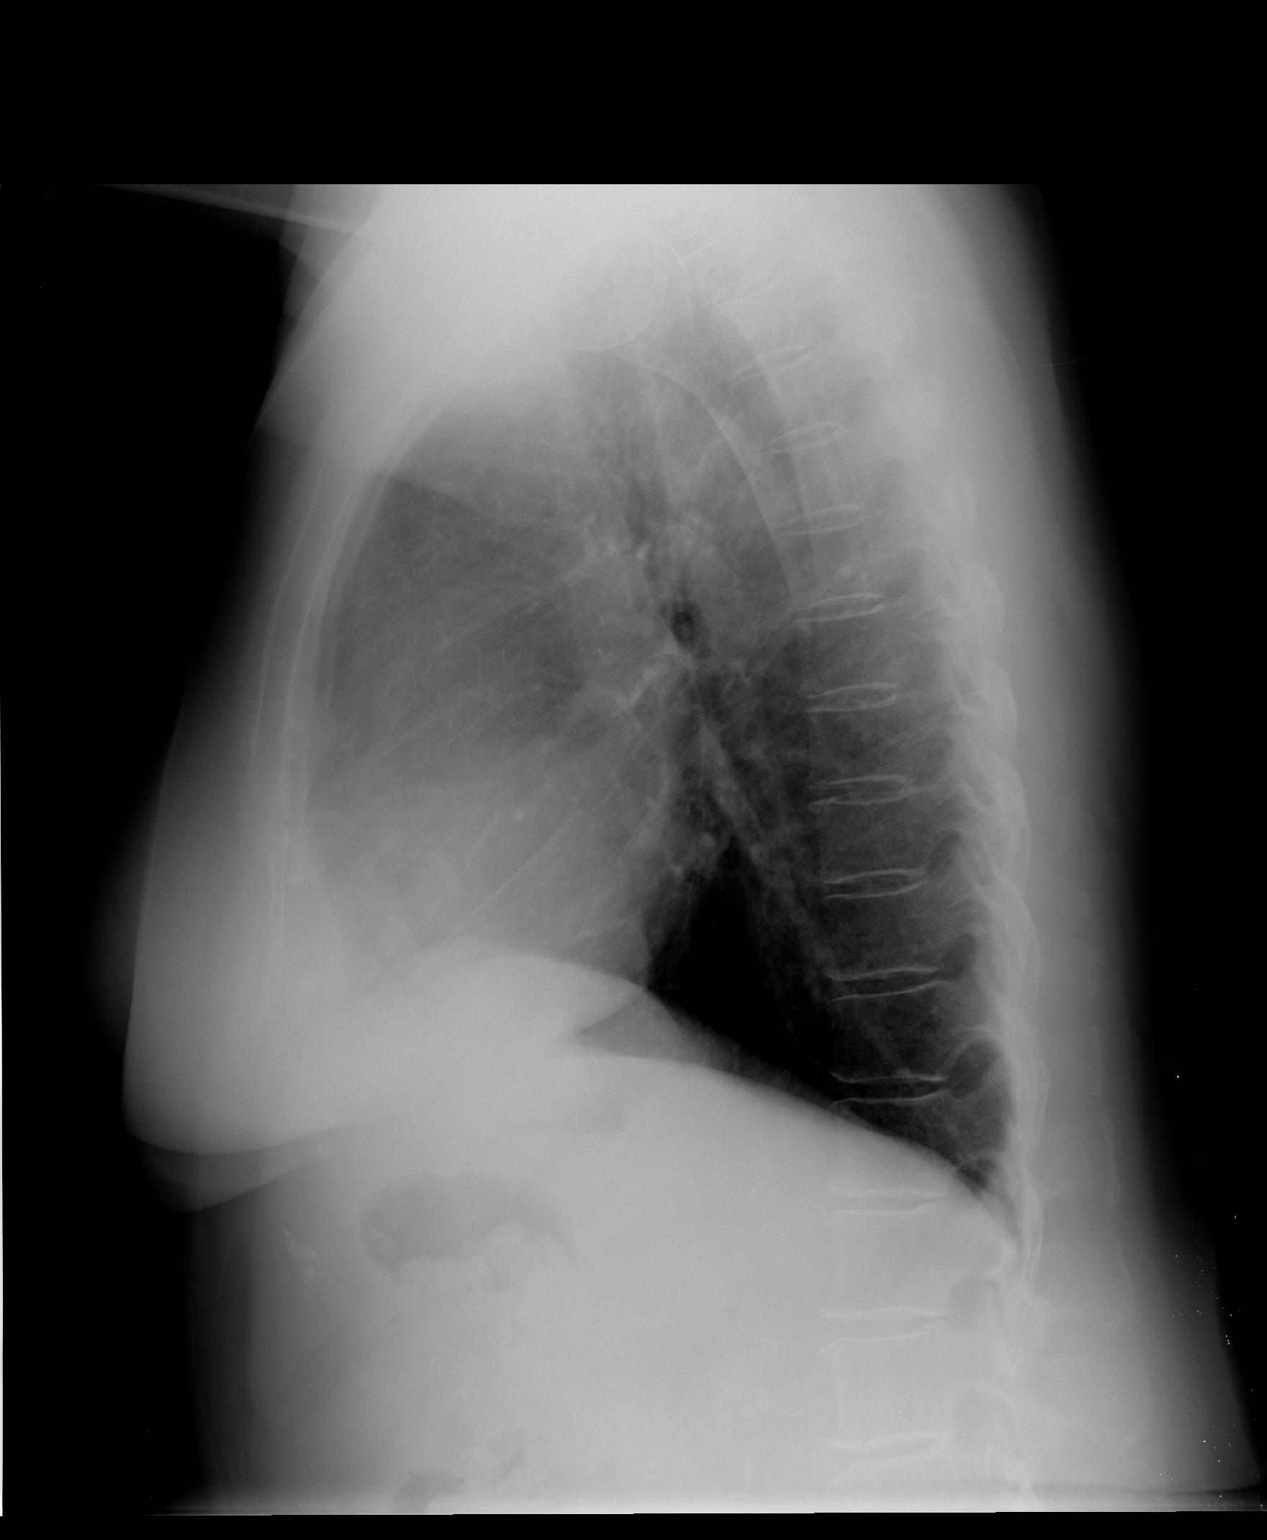

[2 of 2 positions shown; findings below may reference images not displayed]

FINDINGS: Heart size normal.  Vascularity normal.  Lungs are clear
without infiltrate or effusion.  Mild apical scarring bilaterally.
IMPRESSION: No active cardiopulmonary disease.

## 2011-04-27 IMAGING — CR DG PORTABLE PELVIS
1 series · 1 of 1 positions shown · non-contrast
Comparison: Portable intraoperative exam 1193 hours interpreted
postoperatively without prior exams for comparison.

CLINICAL DATA: Degenerative joint disease, hip replacement,
intraoperative

PORTABLE PELVIS

[view not recorded]
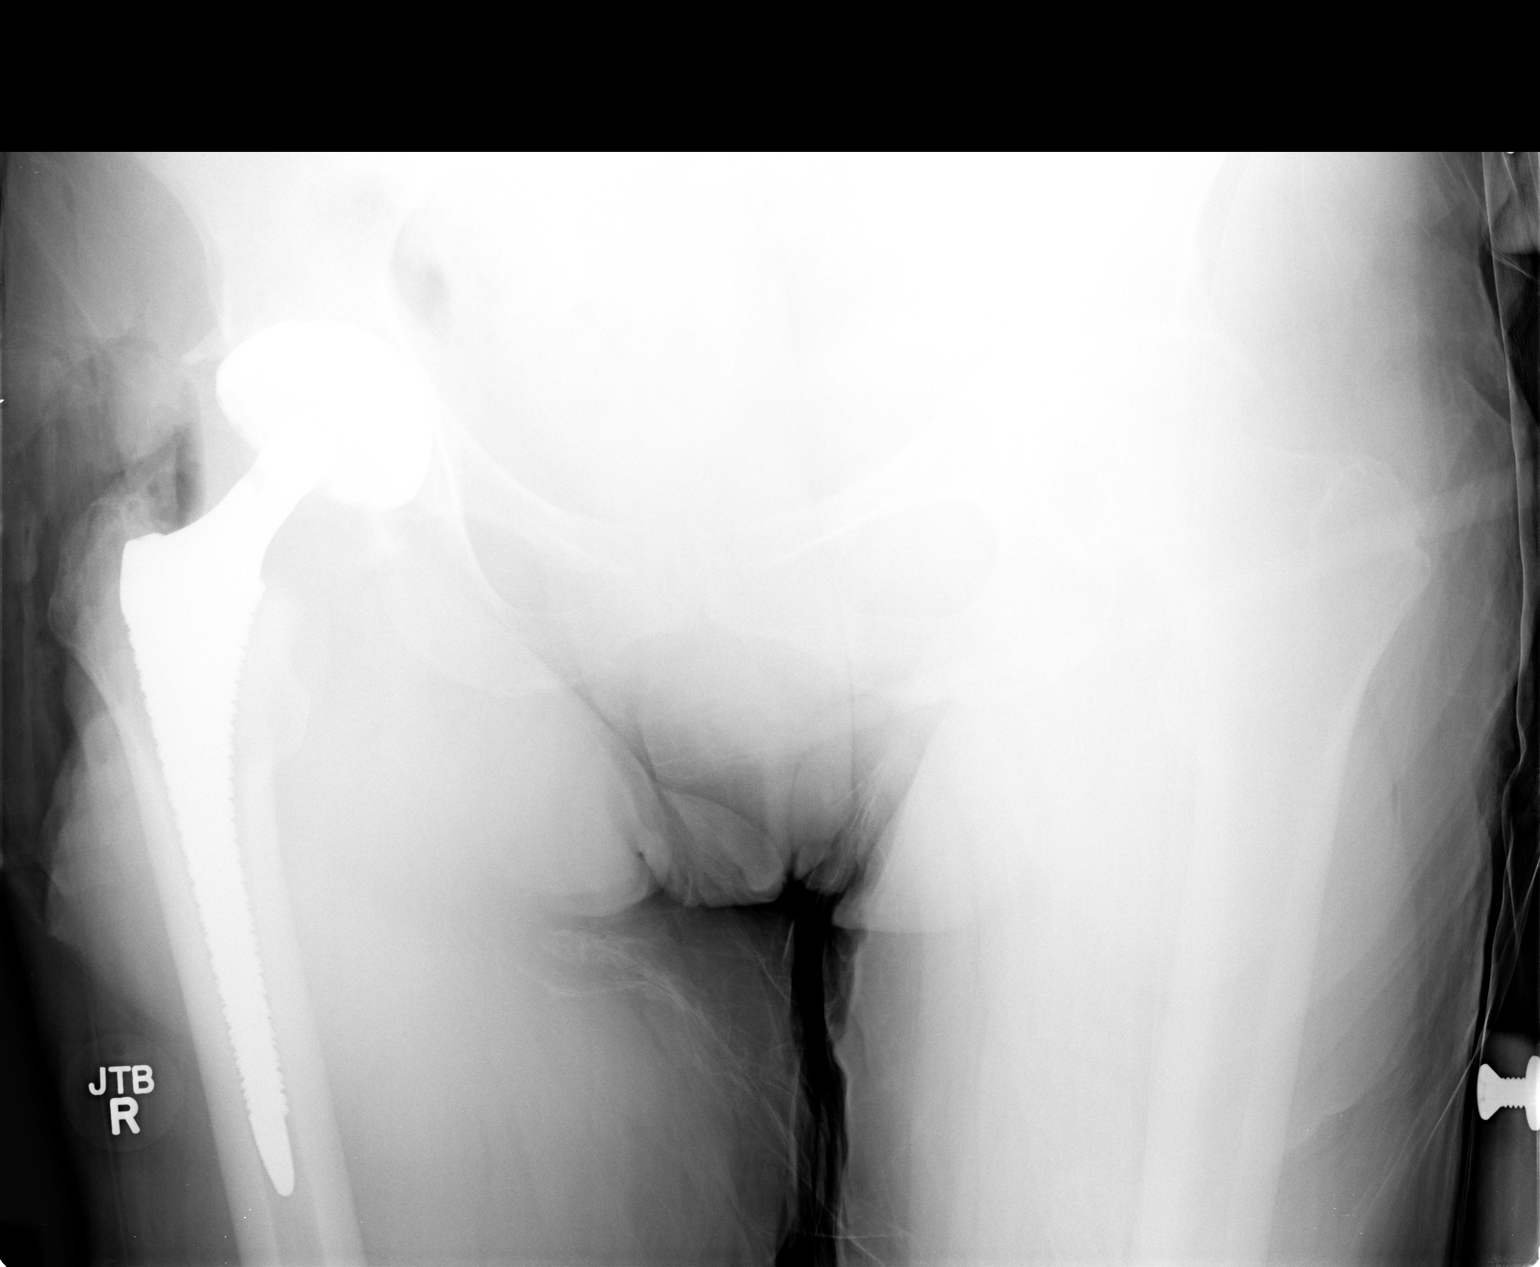

[1 of 1 positions shown; findings below may reference images not displayed]

FINDINGS: Soft tissue changes of right hip surgery are seen.
Acetabular cup and femoral stem of the right hip prosthesis are
identified.
The femoral head component has not been inserted.
Bones are severely demineralized.
No acute fracture noted.
IMPRESSION: Intraoperative exam during placement of components of right hip
prosthesis as above.

## 2011-04-27 IMAGING — CR DG HIP 1V PORT*R*
1 series · 1 of 1 positions shown · non-contrast
Comparison: [HOSPITAL] portable pelvic radiograph
[DATE]/2055 5500 hours

CLINICAL DATA: Post right hip total arthroplasty.

PORTABLE RIGHT HIP - 1 VIEW

[view not recorded]
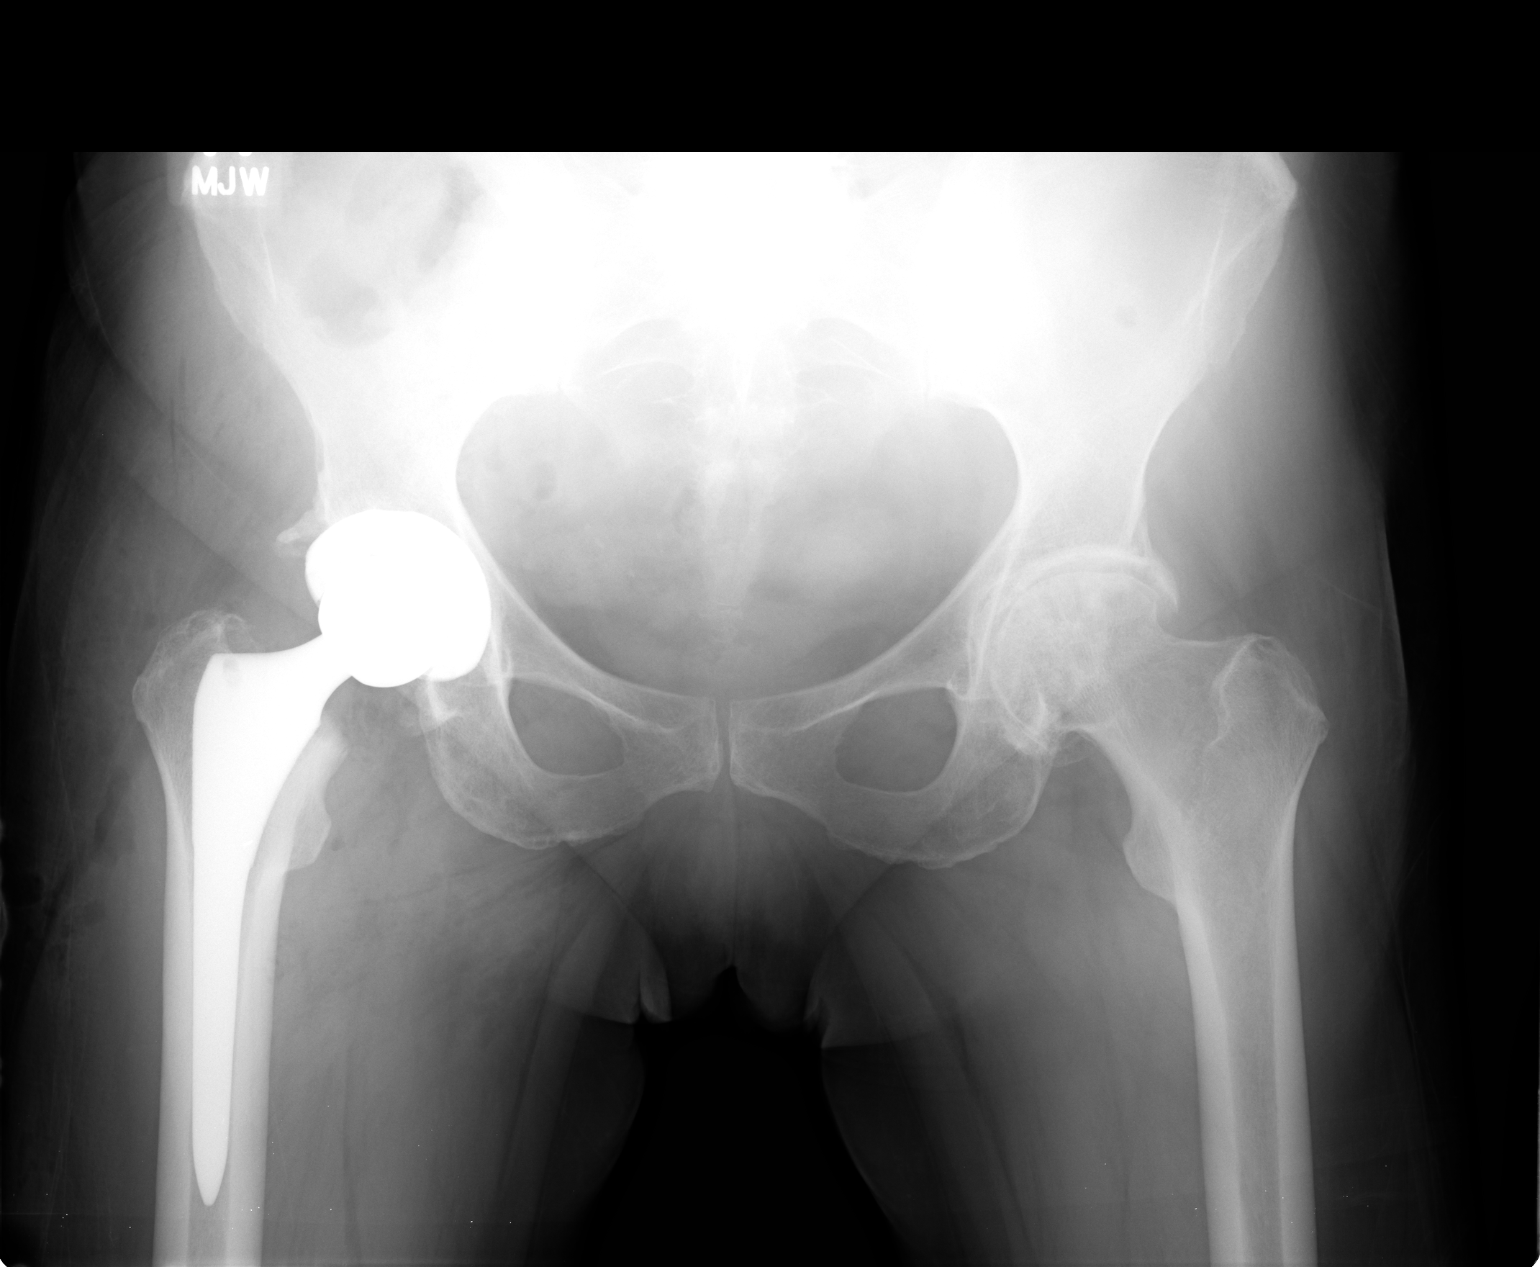

[1 of 1 positions shown; findings below may reference images not displayed]

FINDINGS: Revision with right total hip prosthesis appears in
satisfactory position alignment.  Advanced left hip osteoarthritis
changes seen with degenerative disc disease presumed L4-5.  Bony
pelvis otherwise intact.
IMPRESSION: 1.  Satisfactory revision total right hip prosthesis.
2.  Advanced left hip and L4-5 lumbar disc degenerative changes.

## 2012-02-18 ENCOUNTER — Encounter (HOSPITAL_COMMUNITY)
Admission: RE | Disposition: A | Discharge status: Home / Self Care | Payer: Self-pay | Source: Ambulatory Visit | Attending: Cardiovascular Disease

## 2012-02-18 ENCOUNTER — Ambulatory Visit (HOSPITAL_COMMUNITY)
Admission: RE | Admit: 2012-02-18 | Discharge: 2012-02-18 | Disposition: A | Discharge status: Home / Self Care | Payer: Medicare Other | Source: Ambulatory Visit | Attending: Cardiovascular Disease | Admitting: Cardiovascular Disease

## 2012-02-18 ENCOUNTER — Ambulatory Visit (HOSPITAL_COMMUNITY): Payer: Medicare Other

## 2012-02-18 DIAGNOSIS — I2510 Atherosclerotic heart disease of native coronary artery without angina pectoris: Secondary | ICD-10-CM | POA: Insufficient documentation

## 2012-02-18 DIAGNOSIS — IMO0002 Reserved for concepts with insufficient information to code with codable children: Secondary | ICD-10-CM | POA: Insufficient documentation

## 2012-02-18 DIAGNOSIS — Y840 Cardiac catheterization as the cause of abnormal reaction of the patient, or of later complication, without mention of misadventure at the time of the procedure: Secondary | ICD-10-CM | POA: Insufficient documentation

## 2012-02-18 DIAGNOSIS — I10 Essential (primary) hypertension: Secondary | ICD-10-CM | POA: Insufficient documentation

## 2012-02-18 DIAGNOSIS — Z7982 Long term (current) use of aspirin: Secondary | ICD-10-CM | POA: Insufficient documentation

## 2012-02-18 DIAGNOSIS — Z7902 Long term (current) use of antithrombotics/antiplatelets: Secondary | ICD-10-CM | POA: Insufficient documentation

## 2012-02-18 DIAGNOSIS — Z79899 Other long term (current) drug therapy: Secondary | ICD-10-CM | POA: Insufficient documentation

## 2012-02-18 DIAGNOSIS — E785 Hyperlipidemia, unspecified: Secondary | ICD-10-CM | POA: Insufficient documentation

## 2012-02-18 HISTORY — PX: LEFT HEART CATHETERIZATION WITH CORONARY ANGIOGRAM: SHX5451

## 2012-02-18 LAB — BASIC METABOLIC PANEL
GFR calc Af Amer: 55 mL/min — ABNORMAL LOW (ref >90)
GFR calc non Af Amer: 47 mL/min — ABNORMAL LOW (ref >90)
Potassium: 3.7 mEq/L (ref 3.5–5.1)
Sodium: 137 mEq/L (ref 135–145)

## 2012-02-18 LAB — CBC
Hemoglobin: 13.6 g/dL (ref 12.0–15.0)
MCHC: 33.9 g/dL (ref 30.0–36.0)
RBC: 4.34 MIL/uL (ref 3.87–5.11)
WBC: 3 10*3/uL — ABNORMAL LOW (ref 4.0–10.5)

## 2012-02-18 LAB — PROTIME-INR
INR: 0.98 (ref 0.00–1.49)
Prothrombin Time: 12.9 seconds (ref 11.6–15.2)

## 2012-02-18 SURGERY — LEFT HEART CATHETERIZATION WITH CORONARY ANGIOGRAM
Anesthesia: LOCAL

## 2012-02-18 MED ORDER — HEPARIN (PORCINE) IN NACL 2-0.9 UNIT/ML-% IJ SOLN
INTRAMUSCULAR | Status: AC
  Filled 2012-02-18: qty 1000

## 2012-02-18 MED ORDER — NITROGLYCERIN 0.2 MG/ML ON CALL CATH LAB
INTRAVENOUS | Status: AC
  Filled 2012-02-18: qty 1

## 2012-02-18 MED ORDER — SODIUM CHLORIDE 0.9 % IV SOLN: 1.0000 mL/kg/h | INTRAVENOUS | Status: DC

## 2012-02-18 MED ORDER — ACETAMINOPHEN 325 MG PO TABS: 650.0000 mg | ORAL_TABLET | ORAL | Status: DC | PRN

## 2012-02-18 MED ORDER — ASPIRIN 81 MG PO CHEW
324.0000 mg | CHEWABLE_TABLET | ORAL | Status: AC
  Administered 2012-02-18: 324 mg via ORAL
  Filled 2012-02-18: qty 4

## 2012-02-18 MED ORDER — LIDOCAINE HCL (PF) 1 % IJ SOLN
INTRAMUSCULAR | Status: AC
  Filled 2012-02-18: qty 30

## 2012-02-18 MED ORDER — DIAZEPAM 2 MG PO TABS: 2.0000 mg | ORAL_TABLET | Freq: Four times a day (QID) | ORAL | Status: DC | PRN

## 2012-02-18 MED ORDER — OXYCODONE-ACETAMINOPHEN 5-325 MG PO TABS: 1.0000 | ORAL_TABLET | ORAL | Status: DC | PRN

## 2012-02-18 MED ORDER — SODIUM CHLORIDE 0.9 % IJ SOLN: 3.0000 mL | Freq: Two times a day (BID) | INTRAMUSCULAR | Status: DC

## 2012-02-18 MED ORDER — SODIUM CHLORIDE 0.9 % IV SOLN
INTRAVENOUS | Status: DC
  Administered 2012-02-18: 07:00:00 via INTRAVENOUS

## 2012-02-18 MED ORDER — SODIUM CHLORIDE 0.9 % IJ SOLN: 3.0000 mL | INTRAMUSCULAR | Status: DC | PRN

## 2012-02-18 MED ORDER — ONDANSETRON HCL 4 MG/2ML IJ SOLN: 4.0000 mg | Freq: Four times a day (QID) | INTRAMUSCULAR | Status: DC | PRN

## 2012-02-18 MED ORDER — MIDAZOLAM HCL 2 MG/2ML IJ SOLN
INTRAMUSCULAR | Status: AC
  Filled 2012-02-18: qty 2

## 2012-02-18 MED ORDER — FENTANYL CITRATE 0.05 MG/ML IJ SOLN
INTRAMUSCULAR | Status: AC
  Filled 2012-02-18: qty 2

## 2012-02-18 MED ORDER — SODIUM CHLORIDE 0.9 % IV SOLN: 250.0000 mL | INTRAVENOUS | Status: DC | PRN

## 2012-02-18 NOTE — H&P (Signed)
[name] is an 77 y.o. female.   Chief Complaint: Chest pain HPI: 77 years old female with known CAD has recurrent chest pain. Abnormal stress test last week. With HTN and dyslipidemia as risk factor.  No past medical history on file.  No DM, II, + HTN x 10 yrs. No smoking, No alcohol, No drugs, + dyslipidemia + hypothyroidism.  No past surgical history on file. Cardiac cath + Angioplasty  No family history on file. Social History:  does not have a smoking history on file. She does not have any smokeless tobacco history on file. Her alcohol and drug histories not on file.  Allergies: No Known Allergies  Medications Prior to Admission  Medication Sig Dispense Refill  . acetaminophen (TYLENOL) 500 MG tablet Take 500 mg by mouth every 6 (six) hours as needed. For pain/fever      . ALPRAZolam (XANAX) 0.25 MG tablet Take 0.25 mg by mouth at bedtime as needed. For anxiety/sleep      . aspirin EC 81 MG tablet Take 81 mg by mouth daily.      . citalopram (CELEXA) 20 MG tablet Take 20 mg by mouth daily.      . clopidogrel (PLAVIX) 75 MG tablet Take 75 mg by mouth daily.      . GuaiFENesin (MUCINEX PO) Take 1 tablet by mouth 2 (two) times daily as needed. For congestion      . levothyroxine (SYNTHROID, LEVOTHROID) 25 MCG tablet Take 25 mcg by mouth daily.      Marland Kitchen lisinopril-hydrochlorothiazide (PRINZIDE,ZESTORETIC) 20-12.5 MG per tablet Take 1 tablet by mouth daily.      . pantoprazole (PROTONIX) 40 MG tablet Take 40 mg by mouth daily.      . polyethylene glycol (MIRALAX / GLYCOLAX) packet Take 17 g by mouth daily as needed. For constipation      . potassium chloride SA (K-DUR,KLOR-CON) 20 MEQ tablet Take 20 mEq by mouth 2 (two) times daily.      . rosuvastatin (CRESTOR) 5 MG tablet Take 5 mg by mouth daily.      . Vitamin D, Ergocalciferol, (DRISDOL) 50000 UNITS CAPS Take 50,000 Units by mouth every 14 (fourteen) days.        Results for orders placed during the hospital encounter of  02/18/12 (from the past 48 hour(s))  PROTIME-INR     Status: Normal   Collection Time   02/18/12  6:45 AM      Component Value Range Comment   Prothrombin Time 12.9  11.6 - 15.2 seconds    INR 0.98  0.00 - 1.49   CBC     Status: Abnormal   Collection Time   02/18/12  6:45 AM      Component Value Range Comment   WBC 3.0 (*) 4.0 - 10.5 K/uL    RBC 4.34  3.87 - 5.11 MIL/uL    Hemoglobin 13.6  12.0 - 15.0 g/dL    HCT 40.9  81.1 - 91.4 %    MCV 92.4  78.0 - 100.0 fL    MCH 31.3  26.0 - 34.0 pg    MCHC 33.9  30.0 - 36.0 g/dL    RDW 78.2  95.6 - 21.3 %    Platelets 133 (*) 150 - 400 K/uL    No results found.  @ROS @ No weight gain/loss, + wears glasses and full dentures. No headache or cough. + chest pain and dyspnea.No asthma. No gi bleed, No hepatitis, No blood transfusion, No kidney stone,  No stroke, seizures or psych admissions. + arthritis.  Blood pressure 155/80, pulse 82, temperature 97.3 F (36.3 C), temperature source Oral, resp. rate 18, height 5' (1.524 m), weight 59.875 kg (132 lb), SpO2 95.00%. Gen: Short and well nourished. HEENT: [place]/AT, Brown eyes, conj-pink, Sclera-white, wears glasses and dentures. Neck-No JVD, Lungs-Clear. Heart-Normal S1, S2. Gr II/VI systolic murmur. Abd-Soft. Ext- No edema, cyanosis or clubbing. Neuro-Cranial nerves grossly intact. Ext-No E/C/C. Neuro-Cranial nerves grossly intact. Skin-Warm and dry.  Assessment/Plan Chest pain HTN CAD  C. Cath today.  Fumiko Cham S 02/18/2012, 7:21 AM

## 2012-02-18 NOTE — CV Procedure (Signed)
PROCEDURE:  Left heart catheterization with selective coronary angiography, left ventriculogram.  CLINICAL HISTORY:  This is a 76 years old female with LAD stent had recurrent chest pain and abnormal stress test..  The risks, benefits, and details of the procedure were explained to the patient.  The patient verbalized understanding and wanted to proceed.  Informed written consent was obtained.  PROCEDURE TECHNIQUE:  The patient was approached from the right femoral artery using a 5 French short sheath.  Left coronary angiography was done using a Judkins L4 guide catheter.  Right coronary angiography was done using a Judkins R4 guide catheter.  Left ventriculography was done using a pigtail catheter.    CONTRAST:  Total of 50 cc.  COMPLICATIONS:  Small hematoma.  At the end of the procedure a manual device was used for hemostasis.    HEMODYNAMICS:  Aortic pressure was 176/80; LV pressure was 175/9; LVEDP 13.  There was no gradient between the left ventricle and aorta.    ANGIOGRAM/CORONARY ARTERIOGRAM:   The left main coronary artery is short and unremarkable.  The left anterior descending artery is mildly diseased from origine to pre stent area, has patent proximal stent and unremarkable distal vessel. Diagonal is unremarkable.  The left circumflex artery continues as large OM and unremarkable.  The right coronary artery is dominant and unremarkable.  LEFT VENTRICULOGRAM:  Left ventricular angiogram was done in the 30 RAO projection and revealed normal left ventricular wall motion and systolic function with an estimated ejection fraction of 65%.  LVEDP was 13 mmHg.  IMPRESSION OF HEART CATHETERIZATION:   1. Normal left main coronary artery. 2. Mild disease of left anterior descending artery and unremarkable diagonal. 3. Normal left circumflex artery and its branches. 4. Normal right coronary artery. 5. Normal left ventricular systolic function.  LVEDP 13 mmHg.  Ejection fraction  65%.  RECOMMENDATION:   Medical treatment CT abdomen and pelvis to r/o retroperitoneal bleed.

## 2013-12-31 IMAGING — CT CT ABD-PELV W/O CM
2 of 4 series · 13 of 32 positions shown, 18 images · non-contrast
Comparison: 09/07/2010

CLINICAL DATA: Bilateral groin pain, right greater than left, after
cardiac catheterization.

CT ABDOMEN AND PELVIS WITHOUT CONTRAST
TECHNIQUE: Multidetector CT imaging of the abdomen and pelvis was
performed following the standard protocol without intravenous
contrast.

[Series 2: routine abdomen · axial · 0.95mm/px · z∈[-376,-116]mm · 5 of 80 slices shown, 10 images]
[im 14/80  soft-tissue]
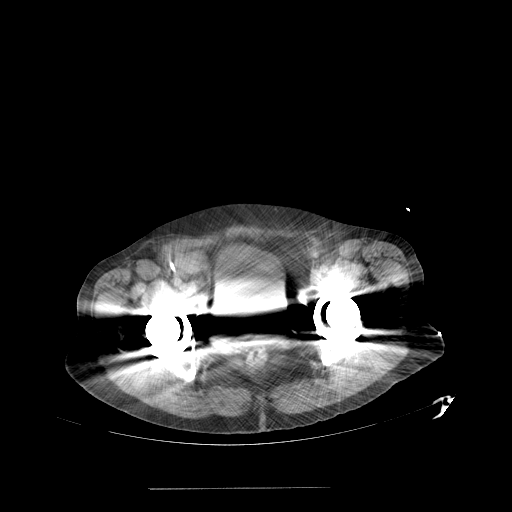
[im 14/80  bone]
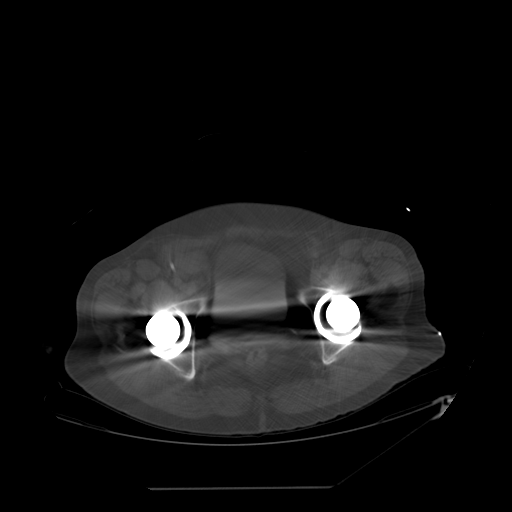
[im 27/80  soft-tissue]
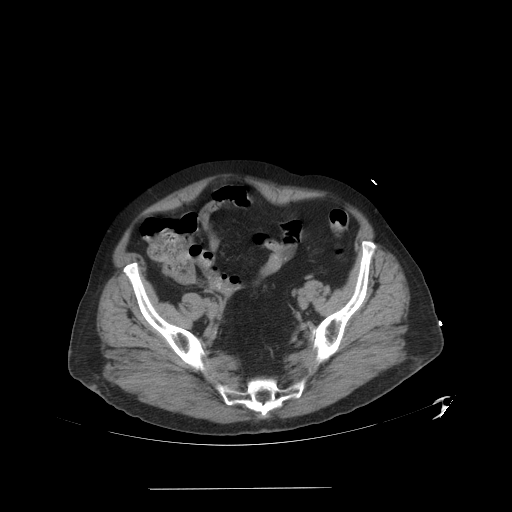
[im 27/80  lung]
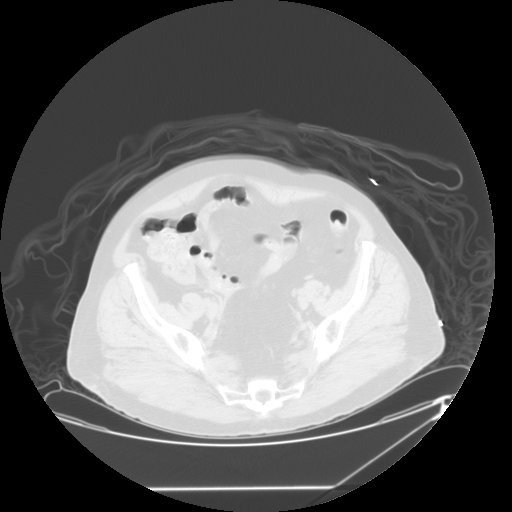
[im 40/80  soft-tissue]
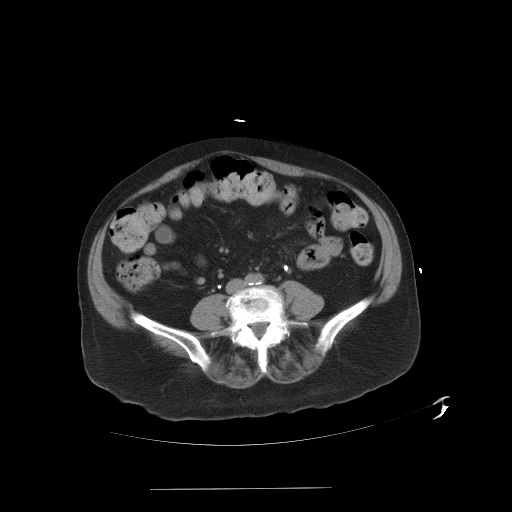
[im 40/80  lung]
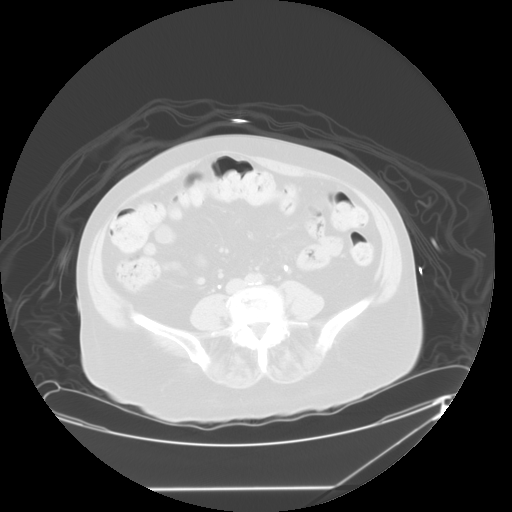
[im 53/80  soft-tissue]
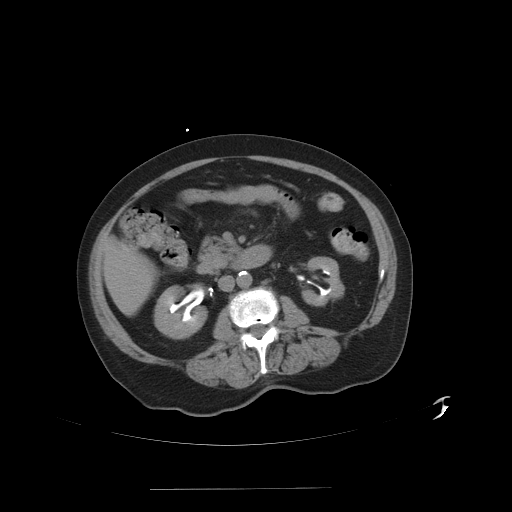
[im 53/80  lung]
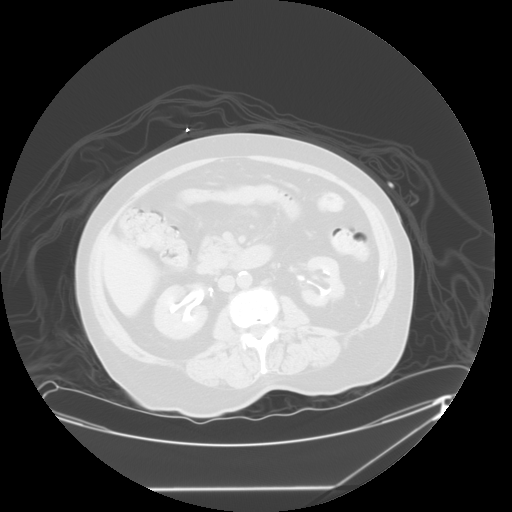
[im 66/80  soft-tissue]
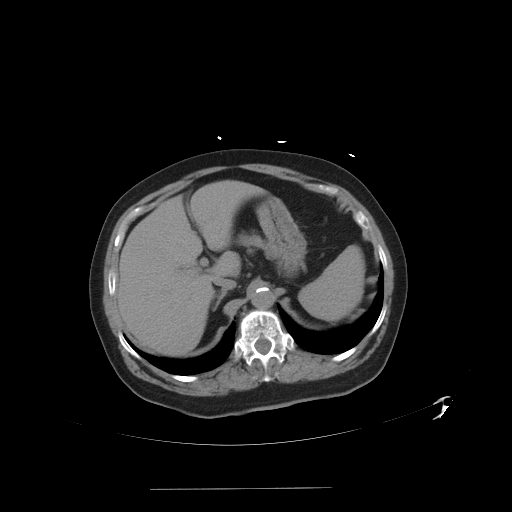
[im 66/80  lung]
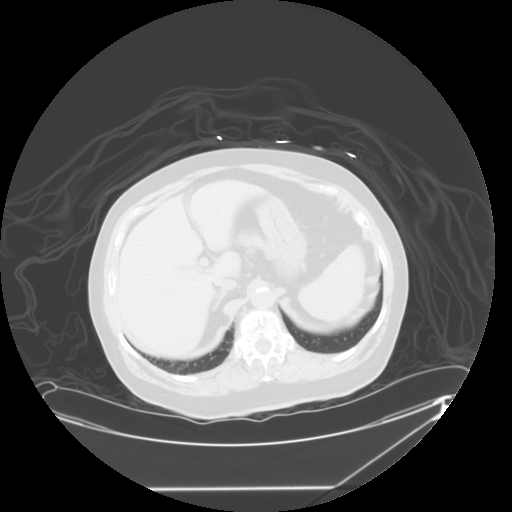

[Series 401: coronal · coronal · 0.95mm/px · 8 of 111 slices shown]
[im 12/111  soft-tissue]
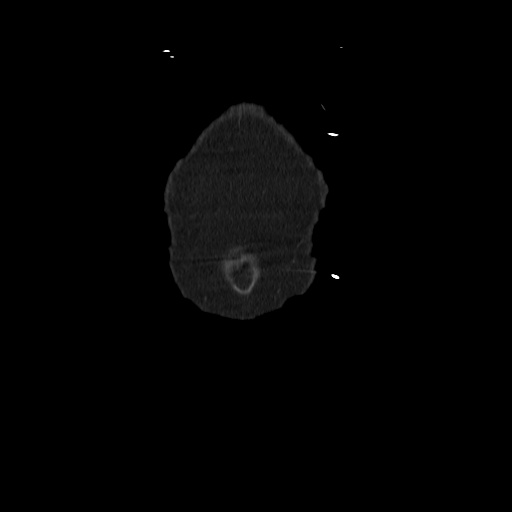
[im 23/111  soft-tissue]
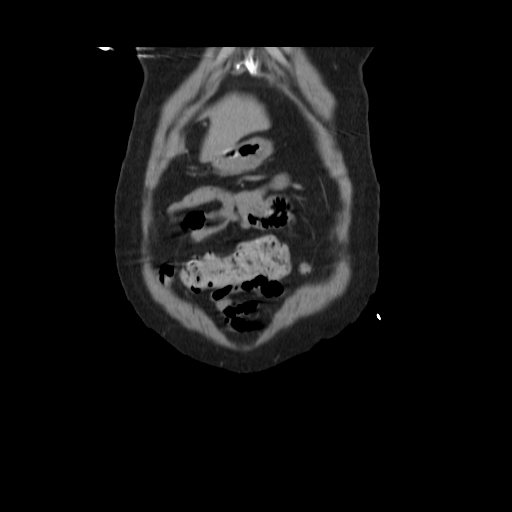
[im 34/111  soft-tissue]
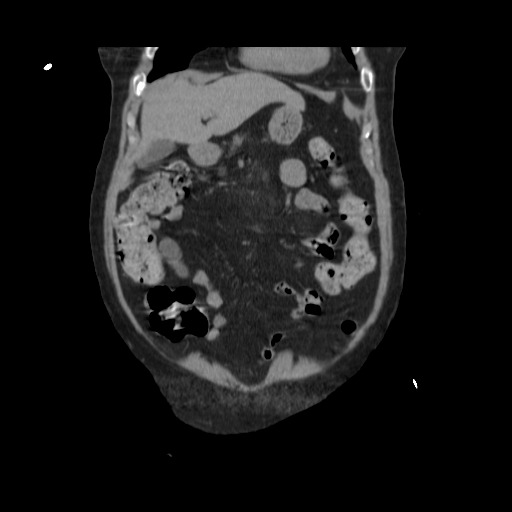
[im 45/111  soft-tissue]
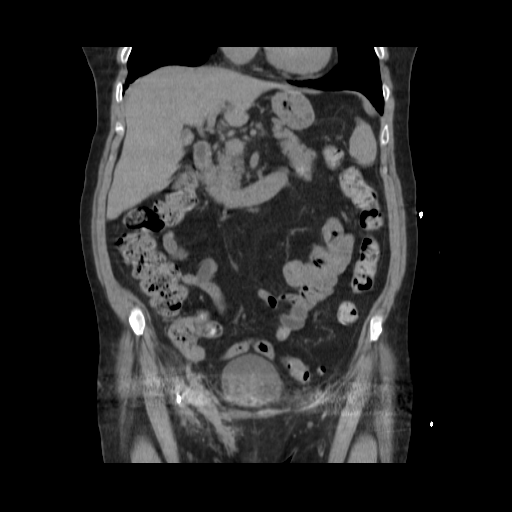
[im 67/111  soft-tissue]
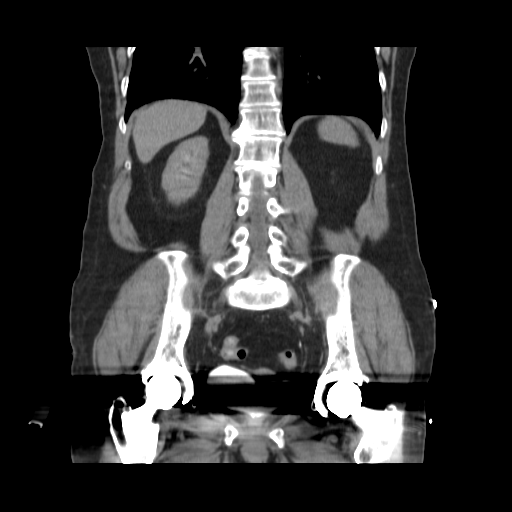
[im 78/111  soft-tissue]
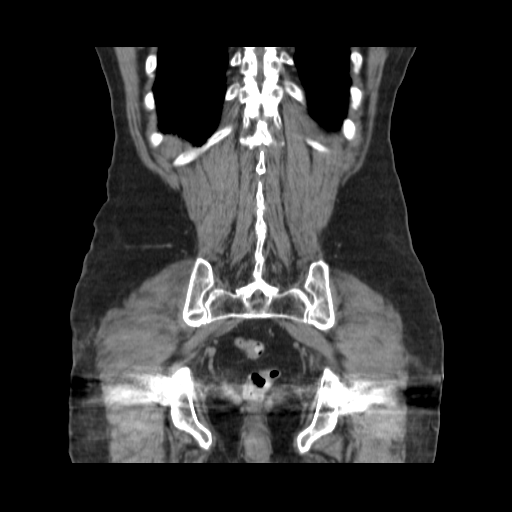
[im 89/111  soft-tissue]
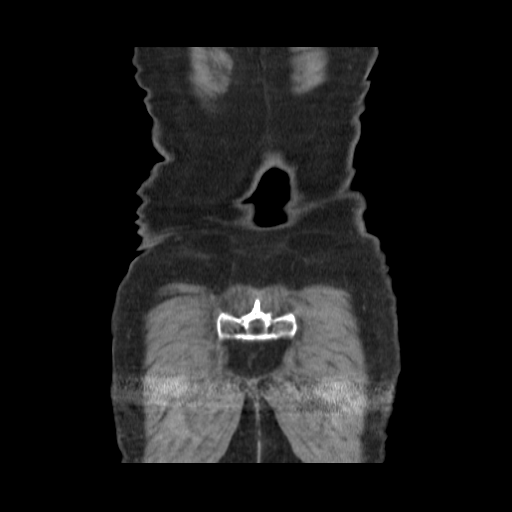
[im 100/111  soft-tissue]
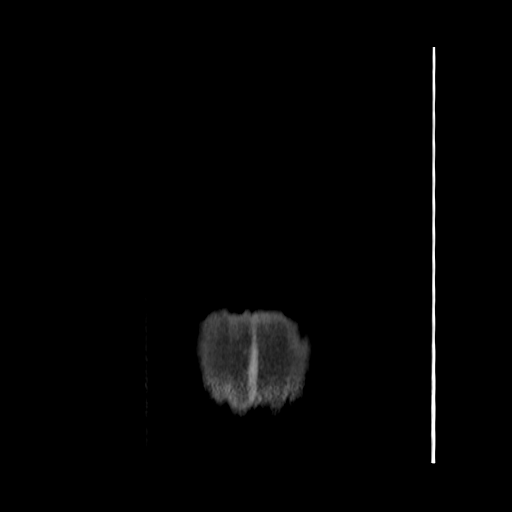

[13 of 32 positions shown; findings below may reference images not displayed]

FINDINGS: No focal abnormalities seen in the liver or spleen.
Small hiatal hernia noted.  Tiny lymph node adjacent to the
esophagogastric junction is unchanged in the interval.  Stomach is
otherwise unremarkable.  The duodenum, pancreas, and adrenal glands
have normal imaging features.  A small 5 mm stone noted in the
dependent portion of the gallbladder.

Contrast excretion from the kidneys is compatible with a recent
cardiac catheterization.  There is a focal cortical scarring in the
left kidney, mainly in the upper pole and interpolar region.  No
abdominal aortic aneurysm.  No free fluid or lymphadenopathy in the
abdomen.

Imaging through the pelvis has fine detail obscured by the
extensive streak artifact emanating from the bilateral hip
prostheses.  No intraperitoneal free fluid is evident within the
limitation of the artifact.  No evidence for pelvic sidewall
lymphadenopathy.  Uterus is visualized.  No adnexal mass.  A right-
sided common femoral arterial sheath/catheter is identified.  There
is some edema/soft tissue stranding in the region of the right
groin, probably secondary to hemorrhage.  A small 3.1 x 2.1 cm soft
tissue attenuating collection medial to the common femoral vessels
is compatible with a hematoma.  There is no extensive
retroperitoneal hematoma tracking up along the right pelvic
sidewall.

Diverticular changes are seen in the sigmoid colon without evidence
for diverticulitis.
The terminal ileum is normal.  The appendix is normal.

Bone windows reveal no worrisome lytic or sclerotic osseous
lesions.
IMPRESSION: There is a probable small 3 x 2 cm hematoma in the region of the
right groin ( although pseudoaneurysm cannot be completely excluded
by CT imaging).  No extensive retroperitoneal hematoma on the
current study.

Cholelithiasis.

## 2014-03-04 ENCOUNTER — Encounter (HOSPITAL_COMMUNITY): Payer: Self-pay | Admitting: Cardiovascular Disease

## 2014-04-14 DIAGNOSIS — C44329 Squamous cell carcinoma of skin of other parts of face: Secondary | ICD-10-CM | POA: Diagnosis not present

## 2014-04-14 DIAGNOSIS — L570 Actinic keratosis: Secondary | ICD-10-CM | POA: Diagnosis not present

## 2014-04-14 DIAGNOSIS — L578 Other skin changes due to chronic exposure to nonionizing radiation: Secondary | ICD-10-CM | POA: Diagnosis not present

## 2014-04-14 DIAGNOSIS — D0439 Carcinoma in situ of skin of other parts of face: Secondary | ICD-10-CM | POA: Diagnosis not present

## 2014-04-22 DIAGNOSIS — L570 Actinic keratosis: Secondary | ICD-10-CM | POA: Diagnosis not present

## 2014-06-28 DIAGNOSIS — L930 Discoid lupus erythematosus: Secondary | ICD-10-CM | POA: Diagnosis not present

## 2014-06-28 DIAGNOSIS — L570 Actinic keratosis: Secondary | ICD-10-CM | POA: Diagnosis not present

## 2014-07-14 DIAGNOSIS — L930 Discoid lupus erythematosus: Secondary | ICD-10-CM | POA: Diagnosis not present

## 2014-09-28 DIAGNOSIS — J019 Acute sinusitis, unspecified: Secondary | ICD-10-CM | POA: Diagnosis not present

## 2014-09-28 DIAGNOSIS — E780 Pure hypercholesterolemia: Secondary | ICD-10-CM | POA: Diagnosis not present

## 2014-09-28 DIAGNOSIS — E039 Hypothyroidism, unspecified: Secondary | ICD-10-CM | POA: Diagnosis not present

## 2014-09-28 DIAGNOSIS — Z0000 Encounter for general adult medical examination without abnormal findings: Secondary | ICD-10-CM | POA: Diagnosis not present

## 2014-09-28 DIAGNOSIS — I10 Essential (primary) hypertension: Secondary | ICD-10-CM | POA: Diagnosis not present

## 2014-09-28 DIAGNOSIS — Z79899 Other long term (current) drug therapy: Secondary | ICD-10-CM | POA: Diagnosis not present

## 2014-09-28 DIAGNOSIS — R350 Frequency of micturition: Secondary | ICD-10-CM | POA: Diagnosis not present

## 2014-09-29 DIAGNOSIS — R350 Frequency of micturition: Secondary | ICD-10-CM | POA: Diagnosis not present

## 2015-08-18 DIAGNOSIS — L989 Disorder of the skin and subcutaneous tissue, unspecified: Secondary | ICD-10-CM | POA: Diagnosis not present

## 2015-08-18 DIAGNOSIS — L570 Actinic keratosis: Secondary | ICD-10-CM | POA: Diagnosis not present

## 2015-08-18 DIAGNOSIS — L578 Other skin changes due to chronic exposure to nonionizing radiation: Secondary | ICD-10-CM | POA: Diagnosis not present

## 2015-08-18 DIAGNOSIS — C44329 Squamous cell carcinoma of skin of other parts of face: Secondary | ICD-10-CM | POA: Diagnosis not present

## 2015-08-18 DIAGNOSIS — L930 Discoid lupus erythematosus: Secondary | ICD-10-CM | POA: Diagnosis not present

## 2015-08-29 DIAGNOSIS — L570 Actinic keratosis: Secondary | ICD-10-CM | POA: Diagnosis not present

## 2015-10-10 DIAGNOSIS — L930 Discoid lupus erythematosus: Secondary | ICD-10-CM | POA: Diagnosis not present

## 2015-10-10 DIAGNOSIS — L300 Nummular dermatitis: Secondary | ICD-10-CM | POA: Diagnosis not present

## 2015-11-03 DIAGNOSIS — I10 Essential (primary) hypertension: Secondary | ICD-10-CM | POA: Diagnosis not present

## 2015-11-03 DIAGNOSIS — E039 Hypothyroidism, unspecified: Secondary | ICD-10-CM | POA: Diagnosis not present

## 2015-11-03 DIAGNOSIS — K219 Gastro-esophageal reflux disease without esophagitis: Secondary | ICD-10-CM | POA: Diagnosis not present

## 2015-11-03 DIAGNOSIS — M5136 Other intervertebral disc degeneration, lumbar region: Secondary | ICD-10-CM | POA: Diagnosis not present

## 2015-11-03 DIAGNOSIS — Z79899 Other long term (current) drug therapy: Secondary | ICD-10-CM | POA: Diagnosis not present

## 2015-11-03 DIAGNOSIS — Z13820 Encounter for screening for osteoporosis: Secondary | ICD-10-CM | POA: Diagnosis not present

## 2015-11-03 DIAGNOSIS — Z0000 Encounter for general adult medical examination without abnormal findings: Secondary | ICD-10-CM | POA: Diagnosis not present

## 2015-11-03 DIAGNOSIS — E7800 Pure hypercholesterolemia, unspecified: Secondary | ICD-10-CM | POA: Diagnosis not present

## 2016-01-17 DIAGNOSIS — J189 Pneumonia, unspecified organism: Secondary | ICD-10-CM | POA: Diagnosis not present

## 2016-07-26 DIAGNOSIS — R0602 Shortness of breath: Secondary | ICD-10-CM | POA: Diagnosis not present

## 2016-07-26 DIAGNOSIS — J329 Chronic sinusitis, unspecified: Secondary | ICD-10-CM | POA: Diagnosis not present

## 2016-07-26 DIAGNOSIS — L0390 Cellulitis, unspecified: Secondary | ICD-10-CM | POA: Diagnosis not present

## 2016-07-26 DIAGNOSIS — I739 Peripheral vascular disease, unspecified: Secondary | ICD-10-CM | POA: Diagnosis not present

## 2016-08-02 DIAGNOSIS — M25551 Pain in right hip: Secondary | ICD-10-CM | POA: Diagnosis not present

## 2016-08-02 DIAGNOSIS — M25552 Pain in left hip: Secondary | ICD-10-CM | POA: Diagnosis not present

## 2016-08-02 DIAGNOSIS — I739 Peripheral vascular disease, unspecified: Secondary | ICD-10-CM | POA: Diagnosis not present

## 2016-09-06 DIAGNOSIS — R0602 Shortness of breath: Secondary | ICD-10-CM | POA: Diagnosis not present

## 2016-09-06 DIAGNOSIS — Z955 Presence of coronary angioplasty implant and graft: Secondary | ICD-10-CM | POA: Diagnosis not present

## 2016-09-06 DIAGNOSIS — I2510 Atherosclerotic heart disease of native coronary artery without angina pectoris: Secondary | ICD-10-CM | POA: Diagnosis not present

## 2016-09-07 DIAGNOSIS — R0602 Shortness of breath: Secondary | ICD-10-CM | POA: Diagnosis not present

## 2016-09-07 DIAGNOSIS — Z955 Presence of coronary angioplasty implant and graft: Secondary | ICD-10-CM | POA: Diagnosis not present

## 2016-09-07 DIAGNOSIS — I2510 Atherosclerotic heart disease of native coronary artery without angina pectoris: Secondary | ICD-10-CM | POA: Diagnosis not present

## 2016-09-13 DIAGNOSIS — R0602 Shortness of breath: Secondary | ICD-10-CM | POA: Diagnosis not present

## 2016-09-13 DIAGNOSIS — I2510 Atherosclerotic heart disease of native coronary artery without angina pectoris: Secondary | ICD-10-CM | POA: Diagnosis not present

## 2016-12-03 DIAGNOSIS — Z0000 Encounter for general adult medical examination without abnormal findings: Secondary | ICD-10-CM | POA: Diagnosis not present

## 2016-12-03 DIAGNOSIS — Z79899 Other long term (current) drug therapy: Secondary | ICD-10-CM | POA: Diagnosis not present

## 2016-12-03 DIAGNOSIS — E039 Hypothyroidism, unspecified: Secondary | ICD-10-CM | POA: Diagnosis not present

## 2016-12-03 DIAGNOSIS — E785 Hyperlipidemia, unspecified: Secondary | ICD-10-CM | POA: Diagnosis not present

## 2017-12-26 DIAGNOSIS — Z0000 Encounter for general adult medical examination without abnormal findings: Secondary | ICD-10-CM | POA: Diagnosis not present

## 2017-12-26 DIAGNOSIS — E039 Hypothyroidism, unspecified: Secondary | ICD-10-CM | POA: Diagnosis not present

## 2018-06-13 DIAGNOSIS — J329 Chronic sinusitis, unspecified: Secondary | ICD-10-CM | POA: Diagnosis not present

## 2019-02-03 DIAGNOSIS — Z79899 Other long term (current) drug therapy: Secondary | ICD-10-CM | POA: Diagnosis not present

## 2019-02-03 DIAGNOSIS — Z0000 Encounter for general adult medical examination without abnormal findings: Secondary | ICD-10-CM | POA: Diagnosis not present

## 2019-02-03 DIAGNOSIS — E039 Hypothyroidism, unspecified: Secondary | ICD-10-CM | POA: Diagnosis not present

## 2019-02-03 DIAGNOSIS — E7800 Pure hypercholesterolemia, unspecified: Secondary | ICD-10-CM | POA: Diagnosis not present

## 2019-11-25 DIAGNOSIS — E7800 Pure hypercholesterolemia, unspecified: Secondary | ICD-10-CM | POA: Diagnosis not present

## 2019-11-25 DIAGNOSIS — Z79899 Other long term (current) drug therapy: Secondary | ICD-10-CM | POA: Diagnosis not present

## 2019-11-25 DIAGNOSIS — E039 Hypothyroidism, unspecified: Secondary | ICD-10-CM | POA: Diagnosis not present

## 2019-11-25 DIAGNOSIS — Z0000 Encounter for general adult medical examination without abnormal findings: Secondary | ICD-10-CM | POA: Diagnosis not present

## 2020-03-22 DIAGNOSIS — E039 Hypothyroidism, unspecified: Secondary | ICD-10-CM | POA: Diagnosis not present

## 2020-03-22 DIAGNOSIS — Z760 Encounter for issue of repeat prescription: Secondary | ICD-10-CM | POA: Diagnosis not present

## 2020-11-21 DIAGNOSIS — I10 Essential (primary) hypertension: Secondary | ICD-10-CM | POA: Diagnosis not present

## 2020-11-21 DIAGNOSIS — E039 Hypothyroidism, unspecified: Secondary | ICD-10-CM | POA: Diagnosis not present

## 2020-11-21 DIAGNOSIS — E7800 Pure hypercholesterolemia, unspecified: Secondary | ICD-10-CM | POA: Diagnosis not present

## 2020-11-21 DIAGNOSIS — K219 Gastro-esophageal reflux disease without esophagitis: Secondary | ICD-10-CM | POA: Diagnosis not present

## 2020-11-21 DIAGNOSIS — I739 Peripheral vascular disease, unspecified: Secondary | ICD-10-CM | POA: Diagnosis not present

## 2020-11-21 DIAGNOSIS — Z0000 Encounter for general adult medical examination without abnormal findings: Secondary | ICD-10-CM | POA: Diagnosis not present

## 2020-11-21 DIAGNOSIS — Z79899 Other long term (current) drug therapy: Secondary | ICD-10-CM | POA: Diagnosis not present

## 2020-12-19 DIAGNOSIS — N1832 Chronic kidney disease, stage 3b: Secondary | ICD-10-CM | POA: Diagnosis not present

## 2020-12-19 DIAGNOSIS — I129 Hypertensive chronic kidney disease with stage 1 through stage 4 chronic kidney disease, or unspecified chronic kidney disease: Secondary | ICD-10-CM | POA: Diagnosis not present

## 2020-12-19 DIAGNOSIS — K219 Gastro-esophageal reflux disease without esophagitis: Secondary | ICD-10-CM | POA: Diagnosis not present

## 2021-02-20 DIAGNOSIS — K8020 Calculus of gallbladder without cholecystitis without obstruction: Secondary | ICD-10-CM | POA: Diagnosis not present

## 2021-02-20 DIAGNOSIS — N1832 Chronic kidney disease, stage 3b: Secondary | ICD-10-CM | POA: Diagnosis not present

## 2021-03-01 DIAGNOSIS — N1832 Chronic kidney disease, stage 3b: Secondary | ICD-10-CM | POA: Diagnosis not present

## 2021-11-29 DIAGNOSIS — E7800 Pure hypercholesterolemia, unspecified: Secondary | ICD-10-CM | POA: Diagnosis not present

## 2021-11-29 DIAGNOSIS — Z760 Encounter for issue of repeat prescription: Secondary | ICD-10-CM | POA: Diagnosis not present

## 2021-11-29 DIAGNOSIS — K219 Gastro-esophageal reflux disease without esophagitis: Secondary | ICD-10-CM | POA: Diagnosis not present

## 2021-11-29 DIAGNOSIS — Z0000 Encounter for general adult medical examination without abnormal findings: Secondary | ICD-10-CM | POA: Diagnosis not present

## 2021-11-29 DIAGNOSIS — Z79899 Other long term (current) drug therapy: Secondary | ICD-10-CM | POA: Diagnosis not present

## 2021-11-29 DIAGNOSIS — E039 Hypothyroidism, unspecified: Secondary | ICD-10-CM | POA: Diagnosis not present

## 2021-11-29 DIAGNOSIS — I10 Essential (primary) hypertension: Secondary | ICD-10-CM | POA: Diagnosis not present

## 2022-05-22 ENCOUNTER — Telehealth: Payer: Self-pay

## 2022-05-22 NOTE — Patient Outreach (Signed)
  Care Coordination   Initial Visit Note   05/22/2022 Name: [name] MRN: [id number] DOB: 1934-05-22  [name] is a 88 y.o. year old female who sees Ronita Hipps, MD for primary care. I spoke with  Percell Locus by phone today.  What matters to the patients health and wellness today?  Placed call to patient today to review and offer [facility] care coordination program.  Patient reports that she I doing well and denies any needs.    SDOH assessments and interventions completed:  No     Care Coordination Interventions:  No, not indicated   Follow up plan: No further intervention required.   Encounter Outcome:  Pt. Refused   Tomasa Rand, RN, BSN, CEN [facility] [employer] [phone]

## 2022-11-30 DIAGNOSIS — R0981 Nasal congestion: Secondary | ICD-10-CM | POA: Diagnosis not present

## 2022-11-30 DIAGNOSIS — R051 Acute cough: Secondary | ICD-10-CM | POA: Diagnosis not present

## 2022-11-30 DIAGNOSIS — R519 Headache, unspecified: Secondary | ICD-10-CM | POA: Diagnosis not present

## 2022-11-30 DIAGNOSIS — R509 Fever, unspecified: Secondary | ICD-10-CM | POA: Diagnosis not present

## 2022-12-20 DIAGNOSIS — I10 Essential (primary) hypertension: Secondary | ICD-10-CM | POA: Diagnosis not present

## 2022-12-20 DIAGNOSIS — Z2821 Immunization not carried out because of patient refusal: Secondary | ICD-10-CM | POA: Diagnosis not present

## 2022-12-20 DIAGNOSIS — I739 Peripheral vascular disease, unspecified: Secondary | ICD-10-CM | POA: Diagnosis not present

## 2022-12-20 DIAGNOSIS — Z79899 Other long term (current) drug therapy: Secondary | ICD-10-CM | POA: Diagnosis not present

## 2022-12-20 DIAGNOSIS — E039 Hypothyroidism, unspecified: Secondary | ICD-10-CM | POA: Diagnosis not present

## 2022-12-20 DIAGNOSIS — K219 Gastro-esophageal reflux disease without esophagitis: Secondary | ICD-10-CM | POA: Diagnosis not present

## 2022-12-20 DIAGNOSIS — Z0000 Encounter for general adult medical examination without abnormal findings: Secondary | ICD-10-CM | POA: Diagnosis not present

## 2022-12-20 DIAGNOSIS — E7800 Pure hypercholesterolemia, unspecified: Secondary | ICD-10-CM | POA: Diagnosis not present

## 2023-05-03 DIAGNOSIS — R509 Fever, unspecified: Secondary | ICD-10-CM | POA: Diagnosis not present

## 2023-05-03 DIAGNOSIS — M791 Myalgia, unspecified site: Secondary | ICD-10-CM | POA: Diagnosis not present

## 2023-05-03 DIAGNOSIS — R0981 Nasal congestion: Secondary | ICD-10-CM | POA: Diagnosis not present

## 2023-05-03 DIAGNOSIS — R051 Acute cough: Secondary | ICD-10-CM | POA: Diagnosis not present

## 2023-05-03 DIAGNOSIS — R0602 Shortness of breath: Secondary | ICD-10-CM | POA: Diagnosis not present

## 2023-05-20 DIAGNOSIS — Z6824 Body mass index (BMI) 24.0-24.9, adult: Secondary | ICD-10-CM | POA: Diagnosis not present

## 2023-11-28 DIAGNOSIS — H6121 Impacted cerumen, right ear: Secondary | ICD-10-CM | POA: Diagnosis not present

## 2023-11-28 DIAGNOSIS — Z6824 Body mass index (BMI) 24.0-24.9, adult: Secondary | ICD-10-CM | POA: Diagnosis not present

## 2024-01-21 DIAGNOSIS — Z0000 Encounter for general adult medical examination without abnormal findings: Secondary | ICD-10-CM | POA: Diagnosis not present

## 2024-01-21 DIAGNOSIS — I10 Essential (primary) hypertension: Secondary | ICD-10-CM | POA: Diagnosis not present

## 2024-01-21 DIAGNOSIS — Z79899 Other long term (current) drug therapy: Secondary | ICD-10-CM | POA: Diagnosis not present

## 2024-01-21 DIAGNOSIS — Z2821 Immunization not carried out because of patient refusal: Secondary | ICD-10-CM | POA: Diagnosis not present

## 2024-01-21 DIAGNOSIS — E039 Hypothyroidism, unspecified: Secondary | ICD-10-CM | POA: Diagnosis not present

## 2024-01-21 DIAGNOSIS — Z6824 Body mass index (BMI) 24.0-24.9, adult: Secondary | ICD-10-CM | POA: Diagnosis not present

## 2024-01-21 DIAGNOSIS — E7800 Pure hypercholesterolemia, unspecified: Secondary | ICD-10-CM | POA: Diagnosis not present

## 2024-01-21 DIAGNOSIS — K219 Gastro-esophageal reflux disease without esophagitis: Secondary | ICD-10-CM | POA: Diagnosis not present
